# Patient Record
Sex: Female | Born: 1970 | Race: White | Hispanic: No | Marital: Single | Smoking: Current every day smoker
Health system: Southern US, Community
[De-identification: ages and names within clinical notes are randomized; demographics above are authoritative.]

## PROBLEM LIST (undated history)

## (undated) ENCOUNTER — Inpatient Hospital Stay (HOSPITAL_COMMUNITY): Payer: Self-pay

## (undated) DIAGNOSIS — J45909 Unspecified asthma, uncomplicated: Secondary | ICD-10-CM

## (undated) DIAGNOSIS — IMO0002 Reserved for concepts with insufficient information to code with codable children: Secondary | ICD-10-CM

## (undated) DIAGNOSIS — E282 Polycystic ovarian syndrome: Secondary | ICD-10-CM

## (undated) DIAGNOSIS — R87619 Unspecified abnormal cytological findings in specimens from cervix uteri: Secondary | ICD-10-CM

## (undated) DIAGNOSIS — F319 Bipolar disorder, unspecified: Secondary | ICD-10-CM

## (undated) DIAGNOSIS — A6000 Herpesviral infection of urogenital system, unspecified: Secondary | ICD-10-CM

## (undated) DIAGNOSIS — F419 Anxiety disorder, unspecified: Secondary | ICD-10-CM

## (undated) DIAGNOSIS — B999 Unspecified infectious disease: Secondary | ICD-10-CM

## (undated) DIAGNOSIS — F32A Depression, unspecified: Secondary | ICD-10-CM

## (undated) DIAGNOSIS — R590 Localized enlarged lymph nodes: Secondary | ICD-10-CM

## (undated) HISTORY — PX: FRACTURE SURGERY: SHX138

## (undated) HISTORY — PX: RIB FRACTURE SURGERY: SHX2358

## (undated) HISTORY — PX: BARTHOLIN GLAND CYST EXCISION: SHX565

## (undated) HISTORY — DX: Unspecified asthma, uncomplicated: J45.909

## (undated) HISTORY — DX: Reserved for concepts with insufficient information to code with codable children: IMO0002

## (undated) HISTORY — PX: DILATION AND CURETTAGE OF UTERUS: SHX78

## (undated) HISTORY — DX: Unspecified abnormal cytological findings in specimens from cervix uteri: R87.619

## (undated) HISTORY — PX: CYST EXCISION PERINEAL: SHX6278

## (undated) HISTORY — PX: LUNG SURGERY: SHX703

## (undated) HISTORY — DX: Unspecified infectious disease: B99.9

---

## 2008-03-09 ENCOUNTER — Emergency Department: Payer: Self-pay | Admitting: Emergency Medicine

## 2012-09-04 ENCOUNTER — Emergency Department: Payer: Self-pay | Admitting: Internal Medicine

## 2012-09-04 LAB — URINALYSIS, COMPLETE
Blood: NEGATIVE
Leukocyte Esterase: NEGATIVE
Nitrite: NEGATIVE
Ph: 5 (ref 4.5–8.0)
Squamous Epithelial: 2

## 2012-09-04 LAB — CBC
HCT: 37.3 % (ref 35.0–47.0)
HGB: 12.7 g/dL (ref 12.0–16.0)
MCHC: 34 g/dL (ref 32.0–36.0)
RBC: 3.71 10*6/uL — ABNORMAL LOW (ref 3.80–5.20)
WBC: 6.8 10*3/uL (ref 3.6–11.0)

## 2012-09-04 LAB — HCG, QUANTITATIVE, PREGNANCY: Beta Hcg, Quant.: 2749 m[IU]/mL — ABNORMAL HIGH

## 2012-10-19 LAB — OB RESULTS CONSOLE HGB/HCT, BLOOD
HCT: 35 %
Hemoglobin: 12.1 g/dL

## 2012-10-19 LAB — OB RESULTS CONSOLE ABO/RH: RH Type: POSITIVE

## 2012-10-19 LAB — OB RESULTS CONSOLE RPR: RPR: NONREACTIVE

## 2012-10-19 LAB — OB RESULTS CONSOLE ANTIBODY SCREEN: Antibody Screen: NEGATIVE

## 2012-10-21 ENCOUNTER — Ambulatory Visit (HOSPITAL_COMMUNITY): Payer: Medicaid Other

## 2012-10-24 ENCOUNTER — Encounter (HOSPITAL_COMMUNITY): Payer: Self-pay | Admitting: Nurse Practitioner

## 2012-10-28 ENCOUNTER — Encounter (HOSPITAL_COMMUNITY): Payer: Medicaid Other

## 2012-11-01 ENCOUNTER — Ambulatory Visit (HOSPITAL_COMMUNITY): Payer: Medicaid Other | Attending: Nurse Practitioner

## 2012-11-01 ENCOUNTER — Inpatient Hospital Stay (HOSPITAL_COMMUNITY)
Admission: AD | Admit: 2012-11-01 | Discharge: 2012-11-02 | Disposition: A | Discharge status: Home / Self Care | Payer: Medicaid Other | Source: Ambulatory Visit | Attending: Family Medicine | Admitting: Family Medicine

## 2012-11-01 ENCOUNTER — Encounter (HOSPITAL_COMMUNITY): Payer: Self-pay | Admitting: *Deleted

## 2012-11-01 DIAGNOSIS — J329 Chronic sinusitis, unspecified: Secondary | ICD-10-CM

## 2012-11-01 DIAGNOSIS — J069 Acute upper respiratory infection, unspecified: Secondary | ICD-10-CM

## 2012-11-01 DIAGNOSIS — J111 Influenza due to unidentified influenza virus with other respiratory manifestations: Secondary | ICD-10-CM | POA: Insufficient documentation

## 2012-11-01 DIAGNOSIS — J45909 Unspecified asthma, uncomplicated: Secondary | ICD-10-CM

## 2012-11-01 DIAGNOSIS — O99891 Other specified diseases and conditions complicating pregnancy: Secondary | ICD-10-CM | POA: Insufficient documentation

## 2012-11-01 HISTORY — DX: Herpesviral infection of urogenital system, unspecified: A60.00

## 2012-11-01 LAB — CBC WITH DIFFERENTIAL/PLATELET
Basophils Absolute: 0 10*3/uL (ref 0.0–0.1)
Basophils Relative: 0 % (ref 0–1)
Eosinophils Absolute: 0 10*3/uL (ref 0.0–0.7)
Eosinophils Relative: 0 % (ref 0–5)
Hemoglobin: 12.6 g/dL (ref 12.0–15.0)
MCH: 33.5 pg (ref 26.0–34.0)
MCV: 94.1 fL (ref 78.0–100.0)
Metamyelocytes Relative: 0 %
Myelocytes: 0 %
Neutro Abs: 7.1 10*3/uL (ref 1.7–7.7)
Neutrophils Relative %: 72 % (ref 43–77)
RBC: 3.76 MIL/uL — ABNORMAL LOW (ref 3.87–5.11)

## 2012-11-01 LAB — URINALYSIS, ROUTINE W REFLEX MICROSCOPIC
Bilirubin Urine: NEGATIVE
Glucose, UA: NEGATIVE mg/dL
Hgb urine dipstick: NEGATIVE
Specific Gravity, Urine: 1.015 (ref 1.005–1.030)
pH: 6.5 (ref 5.0–8.0)

## 2012-11-01 MED ORDER — DM-GUAIFENESIN ER 30-600 MG PO TB12
1.0000 | ORAL_TABLET | Freq: Two times a day (BID) | ORAL | Status: DC
  Administered 2012-11-01: 1 via ORAL
  Filled 2012-11-01 (×2): qty 1

## 2012-11-01 MED ORDER — DM-GUAIFENESIN ER 30-600 MG PO TB12: 1.0000 | ORAL_TABLET | Freq: Two times a day (BID) | ORAL | Status: DC

## 2012-11-01 MED ORDER — IPRATROPIUM BROMIDE 0.02 % IN SOLN
0.5000 mg | Freq: Once | RESPIRATORY_TRACT | Status: AC
  Administered 2012-11-01: 0.5 mg via RESPIRATORY_TRACT

## 2012-11-01 MED ORDER — IPRATROPIUM BROMIDE 0.02 % IN SOLN
RESPIRATORY_TRACT | Status: AC
  Administered 2012-11-01: 0.5 mg
  Filled 2012-11-01: qty 2.5

## 2012-11-01 MED ORDER — OSELTAMIVIR PHOSPHATE 75 MG PO CAPS: 75.0000 mg | ORAL_CAPSULE | Freq: Two times a day (BID) | ORAL | Status: DC

## 2012-11-01 MED ORDER — ALBUTEROL SULFATE (5 MG/ML) 0.5% IN NEBU
INHALATION_SOLUTION | RESPIRATORY_TRACT | Status: AC
  Filled 2012-11-01: qty 0.5

## 2012-11-01 MED ORDER — OSELTAMIVIR PHOSPHATE 75 MG PO CAPS
75.0000 mg | ORAL_CAPSULE | Freq: Once | ORAL | Status: AC
  Administered 2012-11-01: 75 mg via ORAL
  Filled 2012-11-01: qty 1

## 2012-11-01 MED ORDER — OXYCODONE-ACETAMINOPHEN 5-325 MG PO TABS
2.0000 | ORAL_TABLET | Freq: Once | ORAL | Status: AC
  Administered 2012-11-01: 2 via ORAL
  Filled 2012-11-01: qty 2

## 2012-11-01 MED ORDER — DEXTROSE 5 % IN LACTATED RINGERS IV BOLUS
1000.0000 mL | Freq: Once | INTRAVENOUS | Status: AC
  Administered 2012-11-01: 1000 mL via INTRAVENOUS

## 2012-11-01 MED ORDER — ALBUTEROL SULFATE (5 MG/ML) 0.5% IN NEBU
2.5000 mg | INHALATION_SOLUTION | Freq: Once | RESPIRATORY_TRACT | Status: AC
  Administered 2012-11-01: 2.5 mg via RESPIRATORY_TRACT

## 2012-11-01 MED ORDER — ALBUTEROL SULFATE HFA 108 (90 BASE) MCG/ACT IN AERS: 2.0000 | INHALATION_SPRAY | RESPIRATORY_TRACT | Status: DC | PRN

## 2012-11-01 NOTE — MAU Note (Signed)
Pt reports symptoms of weakness, cough, headache, body aches, pressure in rt side of face and jaw, rt side of face swollen, sinus congestion. States she was seen in the ER the second week of December for abd pain and had a positive preg test and was told she had fibroids. States that since she has been coughing the lower abd pain has been much worse.

## 2012-11-01 NOTE — MAU Provider Note (Signed)
History     CSN: [id number]  Arrival date and time: 11/01/12 1324   First Provider Initiated Contact with Patient 11/01/12 2043      Chief Complaint  Patient presents with  . URI  . Abdominal Pain   HPI  Pt is [redacted]w[redacted]d pregnant [id number]; pt complains of sinus pressure and runny nose Fri morning and neck and ears and jaw hurt with pressure; throat is irritated and feels like glass in her throat. Her sinus drainage is thick bright yellow now mixed with blood.  She is also coughing when awake.   Pt sees [facility] for prenatal care.  Pt denies spotting or bleeding or UTI symptoms.  She has had watery stools with incontinence.  Pt has known fibroids.  She has a headache.  She has been nauseated and vomiting and hasn't been able to eat.  Pt has a hx of asthma but has not used inhaler recently.  Pt took Tylenol once without any relief.    Past Medical History  Diagnosis Date  . Genital HSV     Past Surgical History  Procedure Date  . Cesarean section   . Dilation and curettage of uterus     Family History  Problem Relation Age of Onset  . Hypertension Father   . Hyperlipidemia Father   . Cancer Father     liver, lung, bone  . Asthma Father   . Hypertension Maternal Grandfather   . Hyperlipidemia Maternal Grandfather   . Cancer Maternal Grandfather     lung  . Stroke Maternal Grandfather   . Hypertension Paternal Grandfather   . Hyperlipidemia Paternal Grandfather   . Cancer Paternal Grandfather     lung and liver  . Stroke Paternal Grandfather     History  Substance Use Topics  . Smoking status: Current Every Day Smoker -- 0.2 packs/day    Types: Cigarettes  . Smokeless tobacco: Not on file  . Alcohol Use: No    Allergies:  Allergies  Allergen Reactions  . Codeine Itching and Nausea And Vomiting    Prescriptions prior to admission  Medication Sig Dispense Refill  . acetaminophen (TYLENOL) 325 MG tablet Take 650 mg by mouth every 6 (six) hours as needed. Takes for  pain      . Prenatal Vit-Fe Fumarate-FA (PRENATAL MULTIVITAMIN) TABS Take 1 tablet by mouth every morning.        Review of Systems  Constitutional: Positive for fever and chills.  HENT: Positive for congestion and sore throat.        Pressure over right eye and sinus  Eyes: Negative for blurred vision.  Gastrointestinal: Positive for nausea, vomiting, abdominal pain and diarrhea. Negative for constipation.       Pt has abdominal pain when coughs  Genitourinary: Negative for dysuria, urgency and frequency.  Musculoskeletal: Positive for myalgias.  Neurological: Positive for headaches.   Physical Exam   Blood pressure 132/89, pulse 103, temperature 99.3 F (37.4 C), temperature source Oral, resp. rate 20, height 5\' 5"  (1.651 m), weight 173 lb (78.472 kg), last menstrual period 07/25/2012, SpO2 100.00%.  Physical Exam  Vitals reviewed. Constitutional: She is oriented to person, place, and time. She appears well-developed and well-nourished.       Very uncomfortable  HENT:  Head: Normocephalic.  Mouth/Throat: Oropharynx is clear and moist. No oropharyngeal exudate.  Eyes: Pupils are equal, round, and reactive to light.  Neck: Normal range of motion. Neck supple.  Cardiovascular: Normal rate.   Respiratory: No respiratory distress.  She has wheezes. She has rales.       Wheezes in all 4 lung qaudrants with rales in right lung; anterior rales  GI: Soft.       FHT audible with doppler  Musculoskeletal: Normal range of motion.  Neurological: She is alert and oriented to person, place, and time.  Skin: Skin is warm and dry.  Psychiatric: She has a normal mood and affect.    MAU Course  Procedures Breathing treatment IVF D5LR 1 liter given Mucinex DM and albuterol given- pt felt much better Posterior lungs clear; expiratory wheezes anterior right  Tamiflu given 1st dose Percocet given for headache Assessment and Plan  URI- Mucinex DM; Z-pack, saline sprays Sinusitis Flu-  Tamiflu Bronchitis/asthma- albuterol inhaler prescription F/u with scheduled OB appointment- pt would benefit from going to [facility] due to proximity information given to pt  [facility] 11/01/2012, 8:44 PM

## 2012-11-02 MED ORDER — OXYCODONE-ACETAMINOPHEN 5-325 MG PO TABS: 2.0000 | ORAL_TABLET | ORAL | Status: DC | PRN

## 2012-11-02 NOTE — MAU Provider Note (Signed)
Chart reviewed and agree with management and plan.  

## 2012-11-15 ENCOUNTER — Encounter: Payer: Self-pay | Admitting: Family Medicine

## 2012-11-15 ENCOUNTER — Ambulatory Visit (INDEPENDENT_AMBULATORY_CARE_PROVIDER_SITE_OTHER): Payer: Medicaid Other | Admitting: Family Medicine

## 2012-11-15 ENCOUNTER — Encounter (HOSPITAL_COMMUNITY): Payer: Self-pay | Admitting: Family Medicine

## 2012-11-15 ENCOUNTER — Other Ambulatory Visit: Payer: Self-pay | Admitting: Family Medicine

## 2012-11-15 VITALS — BP 121/75 | Wt 171.0 lb

## 2012-11-15 DIAGNOSIS — Z3689 Encounter for other specified antenatal screening: Secondary | ICD-10-CM

## 2012-11-15 DIAGNOSIS — O09529 Supervision of elderly multigravida, unspecified trimester: Secondary | ICD-10-CM

## 2012-11-15 DIAGNOSIS — O34219 Maternal care for unspecified type scar from previous cesarean delivery: Secondary | ICD-10-CM | POA: Insufficient documentation

## 2012-11-15 DIAGNOSIS — B009 Herpesviral infection, unspecified: Secondary | ICD-10-CM

## 2012-11-15 DIAGNOSIS — O99330 Smoking (tobacco) complicating pregnancy, unspecified trimester: Secondary | ICD-10-CM

## 2012-11-15 DIAGNOSIS — O99312 Alcohol use complicating pregnancy, second trimester: Secondary | ICD-10-CM

## 2012-11-15 DIAGNOSIS — Z7289 Other problems related to lifestyle: Secondary | ICD-10-CM

## 2012-11-15 DIAGNOSIS — O3410 Maternal care for benign tumor of corpus uteri, unspecified trimester: Secondary | ICD-10-CM | POA: Insufficient documentation

## 2012-11-15 DIAGNOSIS — F17200 Nicotine dependence, unspecified, uncomplicated: Secondary | ICD-10-CM

## 2012-11-15 DIAGNOSIS — Z3480 Encounter for supervision of other normal pregnancy, unspecified trimester: Secondary | ICD-10-CM | POA: Insufficient documentation

## 2012-11-15 DIAGNOSIS — D259 Leiomyoma of uterus, unspecified: Secondary | ICD-10-CM | POA: Insufficient documentation

## 2012-11-15 DIAGNOSIS — A609 Anogenital herpesviral infection, unspecified: Secondary | ICD-10-CM | POA: Insufficient documentation

## 2012-11-15 NOTE — Patient Instructions (Addendum)
Pregnancy - Second Trimester The second trimester of pregnancy (3 to 6 months) is a period of rapid growth for you and your baby. At the end of the sixth month, your baby is about 9 inches long and weighs 1 1/2 pounds. You will begin to feel the baby move between 18 and 20 weeks of the pregnancy. This is called quickening. Weight gain is faster. A clear fluid (colostrum) may leak out of your breasts. You may feel small contractions of the womb (uterus). This is known as false labor or Braxton-Hicks contractions. This is like a practice for labor when the baby is ready to be born. Usually, the problems with morning sickness have usually passed by the end of your first trimester. Some women develop small dark blotches (called cholasma, mask of pregnancy) on their face that usually goes away after the baby is born. Exposure to the sun makes the blotches worse. Acne may also develop in some pregnant women and pregnant women who have acne, may find that it goes away. PRENATAL EXAMS  Blood work may continue to be done during prenatal exams. These tests are done to check on your health and the probable health of your baby. Blood work is used to follow your blood levels (hemoglobin). Anemia (low hemoglobin) is common during pregnancy. Iron and vitamins are given to help prevent this. You will also be checked for diabetes between 24 and 28 weeks of the pregnancy. Some of the previous blood tests may be repeated.  The size of the uterus is measured during each visit. This is to make sure that the baby is continuing to grow properly according to the dates of the pregnancy.  Your blood pressure is checked every prenatal visit. This is to make sure you are not getting toxemia.  Your urine is checked to make sure you do not have an infection, diabetes or protein in the urine.  Your weight is checked often to make sure gains are happening at the suggested rate. This is to ensure that both you and your baby are  growing normally.  Sometimes, an ultrasound is performed to confirm the proper growth and development of the baby. This is a test which bounces harmless sound waves off the baby so your caregiver can more accurately determine due dates. Sometimes, a specialized test is done on the amniotic fluid surrounding the baby. This test is called an amniocentesis. The amniotic fluid is obtained by sticking a needle into the belly (abdomen). This is done to check the chromosomes in instances where there is a concern about possible genetic problems with the baby. It is also sometimes done near the end of pregnancy if an early delivery is required. In this case, it is done to help make sure the baby's lungs are mature enough for the baby to live outside of the womb. CHANGES OCCURING IN THE SECOND TRIMESTER OF PREGNANCY Your body goes through many changes during pregnancy. They vary from person to person. Talk to your caregiver about changes you notice that you are concerned about.  During the second trimester, you will likely have an increase in your appetite. It is normal to have cravings for certain foods. This varies from person to person and pregnancy to pregnancy.  Your lower abdomen will begin to bulge.  You may have to urinate more often because the uterus and baby are pressing on your bladder. It is also common to get more bladder infections during pregnancy (pain with urination). You can help this by  drinking lots of fluids and emptying your bladder before and after intercourse.  You may begin to get stretch marks on your hips, abdomen, and breasts. These are normal changes in the body during pregnancy. There are no exercises or medications to take that prevent this change.  You may begin to develop swollen and bulging veins (varicose veins) in your legs. Wearing support hose, elevating your feet for 15 minutes, 3 to 4 times a day and limiting salt in your diet helps lessen the problem.  Heartburn may  develop as the uterus grows and pushes up against the stomach. Antacids recommended by your caregiver helps with this problem. Also, eating smaller meals 4 to 5 times a day helps.  Constipation can be treated with a stool softener or adding bulk to your diet. Drinking lots of fluids, vegetables, fruits, and whole grains are helpful.  Exercising is also helpful. If you have been very active up until your pregnancy, most of these activities can be continued during your pregnancy. If you have been less active, it is helpful to start an exercise program such as walking.  Hemorrhoids (varicose veins in the rectum) may develop at the end of the second trimester. Warm sitz baths and hemorrhoid cream recommended by your caregiver helps hemorrhoid problems.  Backaches may develop during this time of your pregnancy. Avoid heavy lifting, wear low heal shoes and practice good posture to help with backache problems.  Some pregnant women develop tingling and numbness of their hand and fingers because of swelling and tightening of ligaments in the wrist (carpel tunnel syndrome). This goes away after the baby is born.  As your breasts enlarge, you may have to get a bigger bra. Get a comfortable, cotton, support bra. Do not get a nursing bra until the last month of the pregnancy if you will be nursing the baby.  You may get a dark line from your belly button to the pubic area called the linea nigra.  You may develop rosy cheeks because of increase blood flow to the face.  You may develop spider looking lines of the face, neck, arms and chest. These go away after the baby is born. HOME CARE INSTRUCTIONS   It is extremely important to avoid all smoking, herbs, alcohol, and unprescribed drugs during your pregnancy. These chemicals affect the formation and growth of the baby. Avoid these chemicals throughout the pregnancy to ensure the delivery of a healthy infant.  Most of your home care instructions are the same  as suggested for the first trimester of your pregnancy. Keep your caregiver's appointments. Follow your caregiver's instructions regarding medication use, exercise and diet.  During pregnancy, you are providing food for you and your baby. Continue to eat regular, well-balanced meals. Choose foods such as meat, fish, milk and other low fat dairy products, vegetables, fruits, and whole-grain breads and cereals. Your caregiver will tell you of the ideal weight gain.  A physical sexual relationship may be continued up until near the end of pregnancy if there are no other problems. Problems could include early (premature) leaking of amniotic fluid from the membranes, vaginal bleeding, abdominal pain, or other medical or pregnancy problems.  Exercise regularly if there are no restrictions. Check with your caregiver if you are unsure of the safety of some of your exercises. The greatest weight gain will occur in the last 2 trimesters of pregnancy. Exercise will help you:  Control your weight.  Get you in shape for labor and delivery.  Lose weight  after you have the baby.  Wear a good support or jogging bra for breast tenderness during pregnancy. This may help if worn during sleep. Pads or tissues may be used in the bra if you are leaking colostrum.  Do not use hot tubs, steam rooms or saunas throughout the pregnancy.  Wear your seat belt at all times when driving. This protects you and your baby if you are in an accident.  Avoid raw meat, uncooked cheese, cat litter boxes and soil used by cats. These carry germs that can cause birth defects in the baby.  The second trimester is also a good time to visit your dentist for your dental health if this has not been done yet. Getting your teeth cleaned is OK. Use a soft toothbrush. Brush gently during pregnancy.  It is easier to loose urine during pregnancy. Tightening up and strengthening the pelvic muscles will help with this problem. Practice stopping  your urination while you are going to the bathroom. These are the same muscles you need to strengthen. It is also the muscles you would use as if you were trying to stop from passing gas. You can practice tightening these muscles up 10 times a set and repeating this about 3 times per day. Once you know what muscles to tighten up, do not perform these exercises during urination. It is more likely to contribute to an infection by backing up the urine.  Ask for help if you have financial, counseling or nutritional needs during pregnancy. Your caregiver will be able to offer counseling for these needs as well as refer you for other special needs.  Your skin may become oily. If so, wash your face with mild soap, use non-greasy moisturizer and oil or cream based makeup. MEDICATIONS AND DRUG USE IN PREGNANCY  Take prenatal vitamins as directed. The vitamin should contain 1 milligram of folic acid. Keep all vitamins out of reach of children. Only a couple vitamins or tablets containing iron may be fatal to a baby or young child when ingested.  Avoid use of all medications, including herbs, over-the-counter medications, not prescribed or suggested by your caregiver. Only take over-the-counter or prescription medicines for pain, discomfort, or fever as directed by your caregiver. Do not use aspirin.  Let your caregiver also know about herbs you may be using.  Alcohol is related to a number of birth defects. This includes fetal alcohol syndrome. All alcohol, in any form, should be avoided completely. Smoking will cause low birth rate and premature babies.  Street or illegal drugs are very harmful to the baby. They are absolutely forbidden. A baby born to an addicted mother will be addicted at birth. The baby will go through the same withdrawal an adult does. SEEK MEDICAL CARE IF:  You have any concerns or worries during your pregnancy. It is better to call with your questions if you feel they cannot wait,  rather than worry about them. SEEK IMMEDIATE MEDICAL CARE IF:   An unexplained oral temperature above 102 F (38.9 C) develops, or as your caregiver suggests.  You have leaking of fluid from the vagina (birth canal). If leaking membranes are suspected, take your temperature and tell your caregiver of this when you call.  There is vaginal spotting, bleeding, or passing clots. Tell your caregiver of the amount and how many pads are used. Light spotting in pregnancy is common, especially following intercourse.  You develop a bad smelling vaginal discharge with a change in the color from clear  to white.  You continue to feel sick to your stomach (nauseated) and have no relief from remedies suggested. You vomit blood or coffee ground-like materials.  You lose more than 2 pounds of weight or gain more than 2 pounds of weight over 1 week, or as suggested by your caregiver.  You notice swelling of your face, hands, feet, or legs.  You get exposed to [place] measles and have never had them.  You are exposed to fifth disease or chickenpox.  You develop belly (abdominal) pain. Round ligament discomfort is a common non-cancerous (benign) cause of abdominal pain in pregnancy. Your caregiver still must evaluate you.  You develop a bad headache that does not go away.  You develop fever, diarrhea, pain with urination, or shortness of breath.  You develop visual problems, blurry, or double vision.  You fall or are in a car accident or any kind of trauma.  There is mental or physical violence at home. Document Released: 09/08/2001 Document Revised: 12/07/2011 Document Reviewed: 03/13/2009 [facility] Patient Information 2013 [place], [place].  Breastfeeding Deciding to breastfeed is one of the best choices you can make for you and your baby. The information that follows gives a brief overview of the benefits of breastfeeding as well as common topics surrounding breastfeeding. BENEFITS OF  BREASTFEEDING For the baby  The first milk (colostrum) helps the baby's digestive system function better.   There are antibodies in the mother's milk that help the baby fight off infections.   The baby has a lower incidence of asthma, allergies, and sudden infant death syndrome (SIDS).   The nutrients in breast milk are better for the baby than infant formulas, and breast milk helps the baby's brain grow better.   Babies who breastfeed have less gas, colic, and constipation.  For the mother  Breastfeeding helps develop a very special bond between the mother and her baby.   Breastfeeding is convenient, always available at the correct temperature, and costs nothing.   Breastfeeding burns calories in the mother and helps her lose weight that was gained during pregnancy.   Breastfeeding makes the uterus contract back down to normal size faster and slows bleeding following delivery.   Breastfeeding mothers have a lower risk of developing breast cancer.  BREASTFEEDING FREQUENCY  A healthy, full-term baby may breastfeed as often as every hour or space his or her feedings to every 3 hours.   Watch your baby for signs of hunger. Nurse your baby if he or she shows signs of hunger. How often you nurse will vary from baby to baby.   Nurse as often as the baby requests, or when you feel the need to reduce the fullness of your breasts.   Awaken the baby if it has been 3 4 hours since the last feeding.   Frequent feeding will help the mother make more milk and will help prevent problems, such as sore nipples and engorgement of the breasts.  BABY'S POSITION AT THE BREAST  Whether lying down or sitting, be sure that the baby's tummy is facing your tummy.   Support the breast with 4 fingers underneath the breast and the thumb above. Make sure your fingers are well away from the nipple and baby's mouth.   Stroke the baby's lips gently with your finger or nipple.   When the  baby's mouth is open wide enough, place all of your nipple and as much of the areola as possible into your baby's mouth.   Pull the baby in  close so the tip of the nose and the baby's cheeks touch the breast during the feeding.  FEEDINGS AND SUCTION  The length of each feeding varies from baby to baby and from feeding to feeding.   The baby must suck about 2 3 minutes for your milk to get to him or her. This is called a "let down." For this reason, allow the baby to feed on each breast as long as he or she wants. Your baby will end the feeding when he or she has received the right balance of nutrients.   To break the suction, put your finger into the corner of the baby's mouth and slide it between his or her gums before removing your breast from his or her mouth. This will help prevent sore nipples.  HOW TO TELL WHETHER YOUR BABY IS GETTING ENOUGH BREAST MILK. Wondering whether or not your baby is getting enough milk is a common concern among mothers. You can be assured that your baby is getting enough milk if:   Your baby is actively sucking and you hear swallowing.   Your baby seems relaxed and satisfied after a feeding.   Your baby nurses at least 8 12 times in a 24 hour time period. Nurse your baby until he or she unlatches or falls asleep at the first breast (at least 10 20 minutes), then offer the second side.   Your baby is wetting 5 6 disposable diapers (6 8 cloth diapers) in a 24 hour period by 99 76 days of age.   Your baby is having at least 3 4 stools every 24 hours for the first 6 weeks. The stool should be soft and yellow.   Your baby should gain 4 7 ounces per week after he or she is 45 days old.   Your breasts feel softer after nursing.  REDUCING BREAST ENGORGEMENT  In the first week after your baby is born, you may experience signs of breast engorgement. When breasts are engorged, they feel heavy, warm, full, and may be tender to the touch. You can reduce  engorgement if you:   Nurse frequently, every 2 3 hours. Mothers who breastfeed early and often have fewer problems with engorgement.   Place light ice packs on your breasts for 10 20 minutes between feedings. This reduces swelling. Wrap the ice packs in a lightweight towel to protect your skin. Bags of frozen vegetables work well for this purpose.   Take a warm shower or apply warm, moist heat to your breast for 5 10 minutes just before each feeding. This increases circulation and helps the milk flow.   Gently massage your breast before and during the feeding. Using your finger tips, massage from the chest wall towards your nipple in a circular motion.   Make sure that the baby empties at least one breast at every feeding before switching sides.   Use a breast pump to empty the breasts if your baby is sleepy or not nursing well. You may also want to pump if you are returning to work oryou feel you are getting engorged.   Avoid bottle feeds, pacifiers, or supplemental feedings of water or juice in place of breastfeeding. Breast milk is all the food your baby needs. It is not necessary for your baby to have water or formula. In fact, to help your breasts make more milk, it is best not to give your baby supplemental feedings during the early weeks.   Be sure the baby is latched  on and positioned properly while breastfeeding.   Wear a supportive bra, avoiding underwire styles.   Eat a balanced diet with enough fluids.   Rest often, relax, and take your prenatal vitamins to prevent fatigue, stress, and anemia.  If you follow these suggestions, your engorgement should improve in 24 48 hours. If you are still experiencing difficulty, call your lactation consultant or caregiver.  CARING FOR YOURSELF Take care of your breasts  Bathe or shower daily.   Avoid using soap on your nipples.   Start feedings on your left breast at one feeding and on your right breast at the next  feeding.   You will notice an increase in your milk supply 2 5 days after delivery. You may feel some discomfort from engorgement, which makes your breasts very firm and often tender. Engorgement "peaks" out within 24 48 hours. In the meantime, apply warm moist towels to your breasts for 5 10 minutes before feeding. Gentle massage and expression of some milk before feeding will soften your breasts, making it easier for your baby to latch on.   Wear a well-fitting nursing bra, and air dry your nipples for a 3 after each feeding.   Only use cotton bra pads.   Only use pure lanolin on your nipples after nursing. You do not need to wash it off before feeding the baby again. Another option is to express a few drops of breast milk and gently massage it into your nipples.  Take care of yourself  Eat well-balanced meals and nutritious snacks.   Drinking milk, fruit juice, and water to satisfy your thirst (about 8 glasses a day).   Get plenty of rest.  Avoid foods that you notice affect the baby in a bad way.  SEEK MEDICAL CARE IF:   You have difficulty with breastfeeding and need help.   You have a hard, red, sore area on your breast that is accompanied by a fever.   Your baby is too sleepy to eat well or is having trouble sleeping.   Your baby is wetting less than 6 diapers a day, by 64 days of age.   Your baby's skin or white part of his or her eyes is more yellow than it was in the hospital.   You feel depressed.  Document Released: 09/14/2005 Document Revised: 03/15/2012 Document Reviewed: 12/13/2011 [facility] Patient Information 2013 [place], [place]. Alcohol Screening in Pregnancy Drinking at any time during pregnancy can cause problems to the fetus. Alcohol travels from the mother's blood stream to the fetus through the placenta. No one knows the amount of alcohol it takes to cause birth defects. However, the more alcohol the pregnant woman drinks, the more it  harms the fetus. Drinking alcohol when pregnant increases the risk of miscarriage and Fetal Alcohol Syndrome (FAS). It is advised not to drink any kind of alcohol when pregnant. The following are three screening tests that may be used to detect alcohol risk in pregnancy. T-ACE T-ACE is a screening instrument that can identify women's prenatal consumption. T-ACE is proven to be a valuable and efficient tool for identifying a range of alcohol use, including any current prenatal alcohol consumption, prepregnancy risk drinking (defined as more than 2 drinks per drinking day), and lifetime alcohol diagnoses.   T: Tolerance. How many drinks* does it take to make you feel high?  A: Have people Annoyed you by criticizing your drinking?  C: Have you ever felt you should Cut down on your drinking?  E:  Eye opener. Have you ever had a drink first thing in the morning to steady your nerves or get rid of a hangover? *Defined as the consumption of 1 ounce or more of alcohol per day while pregnant.  Scores are calculated as follows:  A reply of more than 2 drinks to question T is considered a positive response and scores 2 points.  A positive answer to question A, C, or E scores 1 point each.  A total score of 2 or more points on the T-ACE indicates a positive outcome for pregnancy risk drinking. If you scored 2 points or higher, your caregiver can guide you to help improve the outcome of your pregnancy. TWEAK TWEAK is used to screen for at-risk drinking defined as the consumption of 1 ounce or more of alcohol per day while pregnant. A total score of 2 or more indicates a positive screen for pregnancy risk drinking.   T: Tolerance. How many drinks can you hold?  If 5 or more = 2 points.  W:  Have people Worried or complained about your drinking in the past year?  Yes = 2 points.  E: Eye opener. Do you sometimes have a drink in the morning when you get up?  Yes = 1 point.  A: Amnesia. Have you  been told about things you said or did while drinking that you could not remember?  Yes = 1 point.  K:  Do you sometimes feel you need to cut down on your drinking?  Yes = 1 point. NIAAA QUESTIONAIRE The [employer] on Alcohol Abuse and Alcoholism (NIAAA) has published The Physician's Guide to Helping Patients with Alcohol Problems. This guide presents a brief model for screening and assessing problems with alcohol. NIAAA recommends screening for alcohol-related problems during routine health exams, before prescribing a medicine that interacts with alcohol, and in response to the discovery of medical problems that may be related to alcohol use.  Do you drink?  Do you use drugs?  On average, how many days per week do you drink alcohol (beer, wine, or liquor)?  On a typical day when you drink, how many drinks do you have?  Positive score: more than 14 drinks for men and more than 7 for women.  What is the maximum number of drinks you had on any given occasion during the past month?  Positive score: more than 4 for men and more than 3 for women. Document Released: 12/03/2004 Document Revised: 12/07/2011 Document Reviewed: 01/20/2010 [facility] Patient Information 2013 [place], [place]. Smoking Cessation Quitting smoking is important to your health and has many advantages. However, it is not always easy to quit since nicotine is a very addictive drug. Often times, people try 3 times or more before being able to quit. This document explains the best ways for you to prepare to quit smoking. Quitting takes hard work and a lot of effort, but you can do it. ADVANTAGES OF QUITTING SMOKING  You will live longer, feel better, and live better.  Your body will feel the impact of quitting smoking almost immediately.  Within 20 minutes, blood pressure decreases. Your pulse returns to its normal level.  After 8 hours, carbon monoxide levels in the blood return to normal. Your oxygen level  increases.  After 24 hours, the chance of having a heart attack starts to decrease. Your breath, hair, and body stop smelling like smoke.  After 48 hours, damaged nerve endings begin to recover. Your sense of taste and smell improve.  After 72 hours, the body is virtually free of nicotine. Your bronchial tubes relax and breathing becomes easier.  After 2 to 12 weeks, lungs can hold more air. Exercise becomes easier and circulation improves.  The risk of having a heart attack, stroke, cancer, or lung disease is greatly reduced.  After 1 year, the risk of coronary heart disease is cut in half.  After 5 years, the risk of stroke falls to the same as a nonsmoker.  After 10 years, the risk of lung cancer is cut in half and the risk of other cancers decreases significantly.  After 15 years, the risk of coronary heart disease drops, usually to the level of a nonsmoker.  If you are pregnant, quitting smoking will improve your chances of having a healthy baby.  The people you live with, especially any children, will be healthier.  You will have extra money to spend on things other than cigarettes. QUESTIONS TO THINK ABOUT BEFORE ATTEMPTING TO QUIT You may want to talk about your answers with your caregiver.  Why do you want to quit?  If you tried to quit in the past, what helped and what did not?  What will be the most difficult situations for you after you quit? How will you plan to handle them?  Who can help you through the tough times? Your family? Friends? A caregiver?  What pleasures do you get from smoking? What ways can you still get pleasure if you quit? Here are some questions to ask your caregiver:  How can you help me to be successful at quitting?  What medicine do you think would be best for me and how should I take it?  What should I do if I need more help?  What is smoking withdrawal like? How can I get information on withdrawal? GET READY  Set a quit  date.  Change your environment by getting rid of all cigarettes, ashtrays, matches, and lighters in your home, car, or work. Do not let people smoke in your home.  Review your past attempts to quit. Think about what worked and what did not. GET SUPPORT AND ENCOURAGEMENT You have a better chance of being successful if you have help. You can get support in many ways.  Tell your family, friends, and co-workers that you are going to quit and need their support. Ask them not to smoke around you.  Get individual, group, or telephone counseling and support. Programs are available at [employer] and health centers. Call your local health department for information about programs in your area.  Spiritual beliefs and practices may help some smokers quit.  Download a "quit meter" on your computer to keep track of quit statistics, such as how long you have gone without smoking, cigarettes not smoked, and money saved.  Get a self-help book about quitting smoking and staying off of tobacco. LEARN NEW SKILLS AND BEHAVIORS  Distract yourself from urges to smoke. Talk to someone, go for a walk, or occupy your time with a task.  Change your normal routine. Take a different route to work. Drink tea instead of coffee. Eat breakfast in a different place.  Reduce your stress. Take a hot bath, exercise, or read a book.  Plan something enjoyable to do every day. Reward yourself for not smoking.  Explore interactive web-based programs that specialize in helping you quit. GET MEDICINE AND USE IT CORRECTLY Medicines can help you stop smoking and decrease the urge to smoke. Combining medicine with the above  behavioral methods and support can greatly increase your chances of successfully quitting smoking.  Nicotine replacement therapy helps deliver nicotine to your body without the negative effects and risks of smoking. Nicotine replacement therapy includes nicotine gum, lozenges, inhalers, nasal sprays, and  skin patches. Some may be available over-the-counter and others require a prescription.  Antidepressant medicine helps people abstain from smoking, but how this works is unknown. This medicine is available by prescription.  Nicotinic receptor partial agonist medicine simulates the effect of nicotine in your brain. This medicine is available by prescription. Ask your caregiver for advice about which medicines to use and how to use them based on your health history. Your caregiver will tell you what side effects to look out for if you choose to be on a medicine or therapy. Carefully read the information on the package. Do not use any other product containing nicotine while using a nicotine replacement product.  RELAPSE OR DIFFICULT SITUATIONS Most relapses occur within the first 3 months after quitting. Do not be discouraged if you start smoking again. Remember, most people try several times before finally quitting. You may have symptoms of withdrawal because your body is used to nicotine. You may crave cigarettes, be irritable, feel very hungry, cough often, get headaches, or have difficulty concentrating. The withdrawal symptoms are only temporary. They are strongest when you first quit, but they will go away within 10 14 days. To reduce the chances of relapse, try to:  Avoid drinking alcohol. Drinking lowers your chances of successfully quitting.  Reduce the amount of caffeine you consume. Once you quit smoking, the amount of caffeine in your body increases and can give you symptoms, such as a rapid heartbeat, sweating, and anxiety.  Avoid smokers because they can make you want to smoke.  Do not let weight gain distract you. Many smokers will gain weight when they quit, usually less than 10 pounds. Eat a healthy diet and stay active. You can always lose the weight gained after you quit.  Find ways to improve your mood other than smoking. FOR MORE INFORMATION  www.smokefree.gov  Document  Released: 09/08/2001 Document Revised: 03/15/2012 Document Reviewed: 12/24/2011 [facility] Patient Information 2013 [place], [place].

## 2012-11-15 NOTE — Progress Notes (Signed)
Transfer from [facility]--doing well--needs genetics-missed appt.  Anatomy scheduled. Trying to get into Room at the [facility] for alcohol use Records reviewed.

## 2012-11-16 ENCOUNTER — Encounter: Payer: Self-pay | Admitting: *Deleted

## 2012-11-25 ENCOUNTER — Inpatient Hospital Stay (HOSPITAL_COMMUNITY)
Admission: AD | Admit: 2012-11-25 | Discharge: 2012-11-25 | Disposition: A | Discharge status: Home / Self Care | Payer: Medicaid Other | Source: Ambulatory Visit | Attending: Obstetrics and Gynecology | Admitting: Obstetrics and Gynecology

## 2012-11-25 ENCOUNTER — Encounter (HOSPITAL_COMMUNITY): Payer: Self-pay | Admitting: Family

## 2012-11-25 ENCOUNTER — Inpatient Hospital Stay (HOSPITAL_COMMUNITY): Payer: Medicaid Other

## 2012-11-25 DIAGNOSIS — O4690 Antepartum hemorrhage, unspecified, unspecified trimester: Secondary | ICD-10-CM

## 2012-11-25 DIAGNOSIS — O4692 Antepartum hemorrhage, unspecified, second trimester: Secondary | ICD-10-CM

## 2012-11-25 DIAGNOSIS — O209 Hemorrhage in early pregnancy, unspecified: Secondary | ICD-10-CM | POA: Insufficient documentation

## 2012-11-25 MED ORDER — ACETAMINOPHEN 325 MG PO TABS
650.0000 mg | ORAL_TABLET | Freq: Once | ORAL | Status: AC
  Administered 2012-11-25: 650 mg via ORAL
  Filled 2012-11-25: qty 2

## 2012-11-25 NOTE — MAU Provider Note (Signed)
Chief Complaint: No chief complaint on file.   First Provider Initiated Contact with Patient 11/25/12 1843     SUBJECTIVE HPI: Stephanie Warren is a 42 y.o. G5P1031 at [redacted]w[redacted]d by LMP (no [place]) who presents with onset today of first epidsode of small-moderate amount BRB. Known fibroids. Experiencing bilateral groin pain for weeks , worse with coughing or moving. Denies current substance abuse and states she has quit smoking and alcohol. Denies abdominal trauma or antecedent intercourse. Initial prenatal visit at health department with cultures but did not keep ultrasound appointment. Moved to [place] and had one visit at [facility]. Has moved back to [place] to live at [facility] and now at [facility]   Past Medical History  Diagnosis Date  . Genital HSV   . Abnormal Pap smear   . Infection     yeast  . Asthma    OB History   Grav Para Term Preterm Abortions TAB SAB Ect Mult Living   5 1 1  3 2 1   1      # Outc Date GA Lbr Len/2nd Wgt Sex Del Anes PTL Lv   1 TAB 1991           2 TRM 1997 [redacted]w[redacted]d  3.175kg(7lb) M LTCS   Yes   Comments: Arrest of descent   3 SAB 2003 [redacted]w[redacted]d          4 TAB 2005           5 CUR              Past Surgical History  Procedure Laterality Date  . Cesarean section    . Dilation and curettage of uterus      x3  . Rib fracture surgery      left side / 10 ribs broken. Horse back riding accident.  . Cyst excision perineal    . Bartholin gland cyst excision    . Fracture surgery    . Lung surgery      left lung puncture   History   Social History  . Marital Status: Single    Spouse Name: N/A    Number of Children: N/A  . Years of Education: N/A   Occupational History  . Not on file.   Social History Main Topics  . Smoking status: Current Every Day Smoker -- 0.25 packs/day    Types: Cigarettes  . Smokeless tobacco: Not on file  . Alcohol Use: Yes     Comment: Beer . Maybe 4 amonth  . Drug Use: No  . Sexually Active: Yes   Other Topics  Concern  . Not on file   Social History Narrative  . No narrative on file   No current facility-administered medications on file prior to encounter.   Current Outpatient Prescriptions on File Prior to Encounter  Medication Sig Dispense Refill  . albuterol (PROVENTIL HFA;VENTOLIN HFA) 108 (90 BASE) MCG/ACT inhaler Inhale 2 puffs into the lungs every 4 (four) hours as needed for wheezing.  1 Inhaler  0  . dextromethorphan-guaiFENesin (MUCINEX DM) 30-600 MG per 12 hr tablet Take 1 tablet by mouth 2 (two) times daily.  30 tablet  0  . Prenatal Vit-Fe Fumarate-FA (PRENATAL MULTIVITAMIN) TABS Take 1 tablet by mouth every morning.       Allergies  Allergen Reactions  . Codeine Itching and Nausea And Vomiting    ROS: Pertinent items in HPI  OBJECTIVE Blood pressure 115/79, pulse 92, temperature 97.9 F (36.6 C),  temperature source Oral, resp. rate 18, height 5\' 5"  (1.651 m), weight 77.565 kg (171 lb), last menstrual period 07/25/2012. GENERAL: Well-developed, well-nourished female in no acute distress.  HEENT: Normocephalic HEART: normal rate RESP: normal effort ABDOMEN: Soft, minimally tender at fundus and bilaterally in groin EXTREMITIES: Nontender, no edema NEURO: Alert and oriented SPECULUM EXAM: Small amount of dark blood noted.  No active bleeding from cervix at time of exam.  Bimanual deferred due to ultrasound results.  LAB RESULTS No results found for this or any previous visit (from the past 24 hour(s)).  IMAGING Limited US: Clinical Data: Bleeding.  LIMITED OBSTETRIC ULTRASOUND  Number of Fetuses: 1  Heart Rate: 163 bpm  Movement: present  Breathing: Not assessed.  Presentation: Cephalic  Placental Location: Anterior.  Previa: Absent. The placenta appears to be attached to synechiae  near the internal os.  Amniotic Fluid (Subjective): Normal  Vertical Pocket 5.0 cm  BPD: 4.1 cm 18 w 4 d  MATERNAL FINDINGS:  Cervix: 2.9 cm.  Uterus/Adnexae: Synechiae cross the  internal os. Ovaries not  visualized.  IMPRESSION:  Single intrauterine pregnancy as above.  Recommend followup with non-emergent complete OB 14+ wk US  examination for fetal biometric evaluation and anatomic survey if  not already performed.   MAU COURSE  ASSESSMENT No diagnosis found.  PLAN Discharge home Pelvic Rest Take Tylenol 325 mg 2 tablets by mouth every 4 hours if needed for pain. Drink at least 8 8-oz glasses of water every day. MFM appointment this week.   Care from [facility] being transferred to [facility] according to client but does not have an appointment time.  Message sent to [facility] to contact client re: appointment.    Medication List    ASK your doctor about these medications       albuterol 108 (90 BASE) MCG/ACT inhaler  Commonly known as:  PROVENTIL HFA;VENTOLIN HFA  Inhale 2 puffs into the lungs every 4 (four) hours as needed for wheezing.     dextromethorphan-guaiFENesin 30-600 MG per 12 hr tablet  Commonly known as:  MUCINEX DM  Take 1 tablet by mouth 2 (two) times daily.     prenatal multivitamin Tabs  Take 1 tablet by mouth every morning.         Danae Orleans, CNM 11/25/2012  6:53 PM  Care assumed by Nolene Bernheim, NP at [address], CNM 11/25/2012 7:48 PM

## 2012-11-25 NOTE — MAU Note (Signed)
Patient presents by EMS to MAU with c/o vaginal bleeding; patient states she noticed pink tinged blood this morning; has walked about 4 miles today. Reports she had red blood and 3-4 "stringy" clots at 1700 today.  Reports intermittent lower abdominal cramping throughout the day.

## 2012-11-29 ENCOUNTER — Ambulatory Visit (HOSPITAL_COMMUNITY)
Admission: RE | Admit: 2012-11-29 | Discharge: 2012-11-29 | Disposition: A | Discharge status: Home / Self Care | Payer: Medicaid Other | Source: Ambulatory Visit | Attending: Family Medicine | Admitting: Family Medicine

## 2012-11-29 ENCOUNTER — Other Ambulatory Visit: Payer: Self-pay

## 2012-11-29 DIAGNOSIS — Z3689 Encounter for other specified antenatal screening: Secondary | ICD-10-CM

## 2012-11-29 DIAGNOSIS — O34219 Maternal care for unspecified type scar from previous cesarean delivery: Secondary | ICD-10-CM | POA: Insufficient documentation

## 2012-11-29 DIAGNOSIS — O358XX0 Maternal care for other (suspected) fetal abnormality and damage, not applicable or unspecified: Secondary | ICD-10-CM | POA: Insufficient documentation

## 2012-11-29 DIAGNOSIS — O09529 Supervision of elderly multigravida, unspecified trimester: Secondary | ICD-10-CM

## 2012-11-29 DIAGNOSIS — O99330 Smoking (tobacco) complicating pregnancy, unspecified trimester: Secondary | ICD-10-CM | POA: Insufficient documentation

## 2012-11-29 DIAGNOSIS — O209 Hemorrhage in early pregnancy, unspecified: Secondary | ICD-10-CM | POA: Insufficient documentation

## 2012-11-29 DIAGNOSIS — Z1389 Encounter for screening for other disorder: Secondary | ICD-10-CM | POA: Insufficient documentation

## 2012-11-29 DIAGNOSIS — Z363 Encounter for antenatal screening for malformations: Secondary | ICD-10-CM | POA: Insufficient documentation

## 2012-11-29 LAB — AP-AFP (ALPHA FETOPROTEIN)

## 2012-11-30 ENCOUNTER — Encounter: Payer: Self-pay | Admitting: Family Medicine

## 2012-11-30 NOTE — MAU Provider Note (Signed)
Attestation of Attending Supervision of Advanced Practitioner (CNM/NP): Evaluation and management procedures were performed by the Advanced Practitioner under my supervision and collaboration.  I have reviewed the Advanced Practitioner's note and chart, and I agree with the management and plan.  Karmela Bram 11/30/2012 9:09 AM   

## 2012-12-01 NOTE — Progress Notes (Signed)
Genetic Counseling  High-Risk Gestation Note  Appointment Date:  12/01/2012 Referred By: Reva Bores, MD Date of Birth:  [birth year]    Pregnancy History: [id number] Estimated Date of Delivery: 05/01/13 Estimated Gestational Age: [redacted]w[redacted]d Attending: Particia Nearing, MD   Ms Myiah Petkus was seen for genetic counseling because of a maternal age of 42 y.o..     She was counseled regarding maternal age and the association with risk for chromosome conditions due to nondisjunction with aging of the ova.   We reviewed chromosomes, nondisjunction, and the associated 1 in 35 risk for fetal aneuploidy at [redacted]w[redacted]d gestation related to a maternal age of 42 y.o. at delivery.  She was counseled that the risk for aneuploidy decreases as gestational age increases, accounting for those pregnancies which spontaneously abort.  We specifically discussed Down syndrome (trisomy 50), trisomies 90 and 21, and sex chromosome aneuploidies (47,XXX and 47,XXY) including the common features and prognoses of each.   We reviewed other available screening options including Quad screen, noninvasive prenatal testing (NIPT), and detailed ultrasound. She understands that screening tests are used to modify a patient's a priori risk for aneuploidy, typically based on age.  This estimate provides a pregnancy specific risk assessment.  Specifically, we discussed that NIPT analyzes cell free fetal DNA found in the maternal circulation. This test is not diagnostic for chromosome conditions, but can provide information regarding the presence or absence of extra fetal DNA for chromosomes 13, 18, 21, X, and Y, and missing fetal DNA for chromosome X and Y (Turner syndrome). Thus, it would not identify or rule out all genetic conditions. The reported detection rate is greater than 99% for Trisomy 21, greater than 98% for Trisomy 18, and is approximately 80% (8 out of 10) for Trisomy 13. The false positive rate is reported to be less than 0.1% for any of  these conditions.  In addition, we discussed that ~50-80% of fetuses with Down syndrome and up to 90-95% of fetuses with trisomy 18/13, when well visualized, have detectable anomalies or soft markers by detailed ultrasound (~18+ weeks gestation). She was also counseled regarding diagnostic testing via amniocentesis.  We reviewed the approximate 1 in 300-500 risk for complications for amniocentesis, including spontaneous pregnancy loss. We discussed the risks, limitations, and benefits of each screening and testing option.   After consideration of all the options,Ms. Noralee Stain elected to proceed with cell free fetal DNA testing ([facility]) as well as AFP only screening for open neural tube defects. She declined amniocentesis given the associated risk of complications and given that she is not interested in invasive testing in the pregnancy. NIPT results will be available in 8-10 days.  A detailed ultrasound was performed today.  The ultrasound report will be sent under separate cover. She understands that ultrasound cannot rule out all birth defects or genetic syndromes. A follow-up ultrasound was planned in 4 weeks to complete fetal anatomic survey.   Ms. Makalynn Berwanger was provided with written information regarding cystic fibrosis (CF) including the carrier frequency and incidence in the Caucasian population, the availability of carrier testing and prenatal diagnosis if indicated.  In addition, we discussed that CF is routinely screened for as part of the [place] newborn screening panel.  After careful consideration, she elected to proceed with CF testing today.   Both family histories were reviewed and found to be contributory for a female paternal first cousin to the patient with spina bifida. She reportedly has multiple surgeries including shunt placement. She is reportedly  otherwise healthy and has one daughter who is reportedly healthy. We discussed that spina bifida is a type of open neural tube defect  and affects approximately 1 in 1000 live births. Ms. Juliann Pares was counseled that NTDs occurs as an isolated finding, in the majority of cases.  Isolated NTDs are usually inherited in a multifactorial manner in which there is no prior family history.  Multifactorial conditions have both environmental and genetic factors that contribute to their development.  Both the genetic and environmental factors that contribute to the development of spina bifida are largely unknown; however, some medications and health conditions, such as uncontrolled diabetes and obesity, may increase the chance of spina bifida. We also discussed that NTDs may occur as a feature of an underlying genetic syndrome or condition.  If this relative had a nonsyndromic, multifactorial NTD, the risk of recurrence for the current pregnancy, a fourth degree relative, would not be expected to be increased above the general population risk.  If however, the relative has an underlying genetic syndrome, the risk of recurrence depends upon the inheritance of the condition. We discussed prenatal screening and diagnostic options for detection of NTDs.  Specifically, we reviewed MSAFP and ultrasound. Ms. Daisa Stennis understands that MSAFP does not diagnose or rule out NTDs but rather provides a pregnancy specific risk assessment. We reviewed that detailed ultrasound detects between 80 and 90% of NTDs, when the fetus is well visualized.  Further genetic counseling is warranted if more information is obtained.  Ms. Fahey reported two potential fathers for the pregnancy. She reported no known family history of birth defects, mental retardation, recurrent pregnancy loss, or known genetic conditions for either. However, she had limited information regarding the second potential father of the pregnancy aside from 4 previous healthy children for him. We, thus, cannot comment on how his family history may impact the chance for a birth defect or genetic condition in the  pregnancy. Without further information regarding the provided family history, an accurate genetic risk cannot be calculated. Further genetic counseling is warranted if more information is obtained.  Ms. Schoppe denied exposure to environmental toxins or chemical agents. She denied the use of tobacco or street drugs. She reported drinking alcohol during the first trimester of pregnancy. Ms. Mackintosh stated that she was not aware of the pregnancy until approximately [redacted] weeks gestation. She reported that prior to that time she drank approximately 1-2 fifths of alcohol per week. She stated that she last drank alcohol on 09/27/12. We discussed that prenatal alcohol exposure can increase the risk for growth delays, small head size, heart defects, eye and facial differences, as well as behavior problems and learning disabilities. The risk of these to occur tends to increase with the amount of alcohol consumed. However, because there is no identified safe amount of alcohol in pregnancy, it is recommended to completely avoid alcohol in pregnancy. Given the reported amount of exposure in the first trimester of pregnancy, the risk for associated effects would be increased for the current pregnancy. The patient understands that we are not able to fully assess for fetal alcohol effects during a pregnancy. However, targeted ultrasound is available to assess fetal growth and anatomy in detail. We also discussed the option of fetal echocardiogram to assess the fetal heart in detail given the early alcohol exposure. Ms. Racine Erby declined fetal echocardiogram as this time.    Ms. Dynesha Woolen denied significant viral illnesses during the course of her pregnancy. Her medical and surgical histories were contributory  for asthma, for which she has albuterol inhaler. She also reported taking zyrtec, oxycodone, mucinex, and nasonex during the pregnancy. Prenatal use of oxycodone has not been confirmed to have an increased risk for  birth defects, though its use was associated with an increase in pulmonary valve stenosis in one retrospective study. Use of opioid medication in the third trimester has been associated with neonatal withdrawal. She should not alter her medication use without consulting with her physician first.  I counseled Ms. Noralee Stain regarding the above risks and available options.  The approximate face-to-face time with the genetic counselor was 45 minutes.  Quinn Plowman, MS,  Certified Genetic Counselor 12/01/2012

## 2012-12-09 ENCOUNTER — Telehealth (HOSPITAL_COMMUNITY): Payer: Self-pay | Admitting: MS"

## 2012-12-09 NOTE — Telephone Encounter (Signed)
Called Stephanie Warren to discuss her [facility], cell free fetal DNA testing. Patient was identified by name and DOB. Testing was offered because of advanced maternal age. We reviewed that these are within normal limits, showing a less than 1 in 10,000 risk for trisomies 21, 18 and 13. We reviewed that this testing identifies > 99% of pregnancies with trisomy 21, >98% of pregnancies with trisomy 46, and >80% with trisomy 34; the false positive rate is <0.1% for all conditions. Testing was also performed for X and Y chromosome analysis. This did not show evidence of aneuploidy for X or Y. It was also consistent with female gender. X and Y analysis has a detection rate of approximately 99%. She understands that this testing does not identify all genetic conditions.  Additionally, we discussed results of her cystic fibrosis carrier screening. Carrier screening for cystic fibrosis yielded a normal/negative result for the 97 most common disease-causing mutations, meaning that the risk to be a CF carrier can be reduced from 1 in 25 to approximately 1 in 343.  Therefore, the risk for cystic fibrosis in her current pregnancy is significantly reduced. The patient understands that CF carrier screening can reduce but not eliminate the chance to be a CF carrier.   All questions were answered to her satisfaction, she was encouraged to call with additional questions or concerns.   Quinn Plowman, MS Certified Genetic Counselor 12/09/2012 3:43 PM

## 2012-12-09 NOTE — Telephone Encounter (Signed)
Left message for patient to return call.   Stephanie Warren 12/09/2012 2:12 PM

## 2012-12-12 ENCOUNTER — Ambulatory Visit (INDEPENDENT_AMBULATORY_CARE_PROVIDER_SITE_OTHER): Payer: Medicaid Other | Admitting: Obstetrics and Gynecology

## 2012-12-12 ENCOUNTER — Encounter: Payer: Self-pay | Admitting: Obstetrics and Gynecology

## 2012-12-12 ENCOUNTER — Other Ambulatory Visit: Payer: Self-pay | Admitting: Obstetrics and Gynecology

## 2012-12-12 VITALS — BP 106/74 | Temp 98.7°F | Wt 178.1 lb

## 2012-12-12 DIAGNOSIS — O09529 Supervision of elderly multigravida, unspecified trimester: Secondary | ICD-10-CM

## 2012-12-12 DIAGNOSIS — Z3482 Encounter for supervision of other normal pregnancy, second trimester: Secondary | ICD-10-CM

## 2012-12-12 LAB — POCT URINALYSIS DIP (DEVICE)
Bilirubin Urine: NEGATIVE
Glucose, UA: NEGATIVE mg/dL
Hgb urine dipstick: NEGATIVE
Ketones, ur: NEGATIVE mg/dL
Specific Gravity, Urine: 1.02 (ref 1.005–1.030)

## 2012-12-12 MED ORDER — INFLUENZA VIRUS VACC SPLIT PF IM SUSP: 0.5000 mL | Freq: Once | INTRAMUSCULAR | Status: DC

## 2012-12-12 MED ORDER — TETANUS-DIPHTH-ACELL PERTUSSIS 5-2.5-18.5 LF-MCG/0.5 IM SUSP: 0.5000 mL | Freq: Once | INTRAMUSCULAR | Status: DC

## 2012-12-12 NOTE — Progress Notes (Signed)
PN records from [facility] reviewed. NIPT nl and 18 wk MFM scan normal with need for F/U anatomy at 22 wks. (12/27/12). Seen 2/28 for red vag bleeding and continues to have slight brown-yellow discharge that is very irritative and pruitic. Used vag suppositories for presumptive yeast from MFM but sx not improved. SSE: no blood, greyish discharge. WP sent. SVE: L/C/H. RLP discussed.

## 2012-12-12 NOTE — Patient Instructions (Signed)
Pregnancy - Second Trimester The second trimester of pregnancy (3 to 6 months) is a period of rapid growth for you and your baby. At the end of the sixth month, your baby is about 9 inches long and weighs 1 1/2 pounds. You will begin to feel the baby move between 18 and 20 weeks of the pregnancy. This is called quickening. Weight gain is faster. A clear fluid (colostrum) may leak out of your breasts. You may feel small contractions of the womb (uterus). This is known as false labor or Braxton-Hicks contractions. This is like a practice for labor when the baby is ready to be born. Usually, the problems with morning sickness have usually passed by the end of your first trimester. Some women develop small dark blotches (called cholasma, mask of pregnancy) on their face that usually goes away after the baby is born. Exposure to the sun makes the blotches worse. Acne may also develop in some pregnant women and pregnant women who have acne, may find that it goes away. PRENATAL EXAMS  Blood work may continue to be done during prenatal exams. These tests are done to check on your health and the probable health of your baby. Blood work is used to follow your blood levels (hemoglobin). Anemia (low hemoglobin) is common during pregnancy. Iron and vitamins are given to help prevent this. You will also be checked for diabetes between 24 and 28 weeks of the pregnancy. Some of the previous blood tests may be repeated.  The size of the uterus is measured during each visit. This is to make sure that the baby is continuing to grow properly according to the dates of the pregnancy.  Your blood pressure is checked every prenatal visit. This is to make sure you are not getting toxemia.  Your urine is checked to make sure you do not have an infection, diabetes or protein in the urine.  Your weight is checked often to make sure gains are happening at the suggested rate. This is to ensure that both you and your baby are growing  normally.  Sometimes, an ultrasound is performed to confirm the proper growth and development of the baby. This is a test which bounces harmless sound waves off the baby so your caregiver can more accurately determine due dates. Sometimes, a specialized test is done on the amniotic fluid surrounding the baby. This test is called an amniocentesis. The amniotic fluid is obtained by sticking a needle into the belly (abdomen). This is done to check the chromosomes in instances where there is a concern about possible genetic problems with the baby. It is also sometimes done near the end of pregnancy if an early delivery is required. In this case, it is done to help make sure the baby's lungs are mature enough for the baby to live outside of the womb. CHANGES OCCURING IN THE SECOND TRIMESTER OF PREGNANCY Your body goes through many changes during pregnancy. They vary from person to person. Talk to your caregiver about changes you notice that you are concerned about.  During the second trimester, you will likely have an increase in your appetite. It is normal to have cravings for certain foods. This varies from person to person and pregnancy to pregnancy.  Your lower abdomen will begin to bulge.  You may have to urinate more often because the uterus and baby are pressing on your bladder. It is also common to get more bladder infections during pregnancy (pain with urination). You can help this by   drinking lots of fluids and emptying your bladder before and after intercourse.  You may begin to get stretch marks on your hips, abdomen, and breasts. These are normal changes in the body during pregnancy. There are no exercises or medications to take that prevent this change.  You may begin to develop swollen and bulging veins (varicose veins) in your legs. Wearing support hose, elevating your feet for 15 minutes, 3 to 4 times a day and limiting salt in your diet helps lessen the problem.  Heartburn may develop  as the uterus grows and pushes up against the stomach. Antacids recommended by your caregiver helps with this problem. Also, eating smaller meals 4 to 5 times a day helps.  Constipation can be treated with a stool softener or adding bulk to your diet. Drinking lots of fluids, vegetables, fruits, and whole grains are helpful.  Exercising is also helpful. If you have been very active up until your pregnancy, most of these activities can be continued during your pregnancy. If you have been less active, it is helpful to start an exercise program such as walking.  Hemorrhoids (varicose veins in the rectum) may develop at the end of the second trimester. Warm sitz baths and hemorrhoid cream recommended by your caregiver helps hemorrhoid problems.  Backaches may develop during this time of your pregnancy. Avoid heavy lifting, wear low heal shoes and practice good posture to help with backache problems.  Some pregnant women develop tingling and numbness of their hand and fingers because of swelling and tightening of ligaments in the wrist (carpel tunnel syndrome). This goes away after the baby is born.  As your breasts enlarge, you may have to get a bigger bra. Get a comfortable, cotton, support bra. Do not get a nursing bra until the last month of the pregnancy if you will be nursing the baby.  You may get a dark line from your belly button to the pubic area called the linea nigra.  You may develop rosy cheeks because of increase blood flow to the face.  You may develop spider looking lines of the face, neck, arms and chest. These go away after the baby is born. HOME CARE INSTRUCTIONS   It is extremely important to avoid all smoking, herbs, alcohol, and unprescribed drugs during your pregnancy. These chemicals affect the formation and growth of the baby. Avoid these chemicals throughout the pregnancy to ensure the delivery of a healthy infant.  Most of your home care instructions are the same as  suggested for the first trimester of your pregnancy. Keep your caregiver's appointments. Follow your caregiver's instructions regarding medication use, exercise and diet.  During pregnancy, you are providing food for you and your baby. Continue to eat regular, well-balanced meals. Choose foods such as meat, fish, milk and other low fat dairy products, vegetables, fruits, and whole-grain breads and cereals. Your caregiver will tell you of the ideal weight gain.  A physical sexual relationship may be continued up until near the end of pregnancy if there are no other problems. Problems could include early (premature) leaking of amniotic fluid from the membranes, vaginal bleeding, abdominal pain, or other medical or pregnancy problems.  Exercise regularly if there are no restrictions. Check with your caregiver if you are unsure of the safety of some of your exercises. The greatest weight gain will occur in the last 2 trimesters of pregnancy. Exercise will help you:  Control your weight.  Get you in shape for labor and delivery.  Lose weight   after you have the baby.  Wear a good support or jogging bra for breast tenderness during pregnancy. This may help if worn during sleep. Pads or tissues may be used in the bra if you are leaking colostrum.  Do not use hot tubs, steam rooms or saunas throughout the pregnancy.  Wear your seat belt at all times when driving. This protects you and your baby if you are in an accident.  Avoid raw meat, uncooked cheese, cat litter boxes and soil used by cats. These carry germs that can cause birth defects in the baby.  The second trimester is also a good time to visit your dentist for your dental health if this has not been done yet. Getting your teeth cleaned is OK. Use a soft toothbrush. Brush gently during pregnancy.  It is easier to loose urine during pregnancy. Tightening up and strengthening the pelvic muscles will help with this problem. Practice stopping your  urination while you are going to the bathroom. These are the same muscles you need to strengthen. It is also the muscles you would use as if you were trying to stop from passing gas. You can practice tightening these muscles up 10 times a set and repeating this about 3 times per day. Once you know what muscles to tighten up, do not perform these exercises during urination. It is more likely to contribute to an infection by backing up the urine.  Ask for help if you have financial, counseling or nutritional needs during pregnancy. Your caregiver will be able to offer counseling for these needs as well as refer you for other special needs.  Your skin may become oily. If so, wash your face with mild soap, use non-greasy moisturizer and oil or cream based makeup. MEDICATIONS AND DRUG USE IN PREGNANCY  Take prenatal vitamins as directed. The vitamin should contain 1 milligram of folic acid. Keep all vitamins out of reach of children. Only a couple vitamins or tablets containing iron may be fatal to a baby or young child when ingested.  Avoid use of all medications, including herbs, over-the-counter medications, not prescribed or suggested by your caregiver. Only take over-the-counter or prescription medicines for pain, discomfort, or fever as directed by your caregiver. Do not use aspirin.  Let your caregiver also know about herbs you may be using.  Alcohol is related to a number of birth defects. This includes fetal alcohol syndrome. All alcohol, in any form, should be avoided completely. Smoking will cause low birth rate and premature babies.  Street or illegal drugs are very harmful to the baby. They are absolutely forbidden. A baby born to an addicted mother will be addicted at birth. The baby will go through the same withdrawal an adult does. SEEK MEDICAL CARE IF:  You have any concerns or worries during your pregnancy. It is better to call with your questions if you feel they cannot wait, rather  than worry about them. SEEK IMMEDIATE MEDICAL CARE IF:   An unexplained oral temperature above 102 F (38.9 C) develops, or as your caregiver suggests.  You have leaking of fluid from the vagina (birth canal). If leaking membranes are suspected, take your temperature and tell your caregiver of this when you call.  There is vaginal spotting, bleeding, or passing clots. Tell your caregiver of the amount and how many pads are used. Light spotting in pregnancy is common, especially following intercourse.  You develop a bad smelling vaginal discharge with a change in the color from clear   to white.  You continue to feel sick to your stomach (nauseated) and have no relief from remedies suggested. You vomit blood or coffee ground-like materials.  You lose more than 2 pounds of weight or gain more than 2 pounds of weight over 1 week, or as suggested by your caregiver.  You notice swelling of your face, hands, feet, or legs.  You get exposed to German measles and have never had them.  You are exposed to fifth disease or chickenpox.  You develop belly (abdominal) pain. Round ligament discomfort is a common non-cancerous (benign) cause of abdominal pain in pregnancy. Your caregiver still must evaluate you.  You develop a bad headache that does not go away.  You develop fever, diarrhea, pain with urination, or shortness of breath.  You develop visual problems, blurry, or double vision.  You fall or are in a car accident or any kind of trauma.  There is mental or physical violence at home. Document Released: 09/08/2001 Document Revised: 12/07/2011 Document Reviewed: 03/13/2009 [employer] Patient Information 2013 [employer].  

## 2012-12-12 NOTE — Progress Notes (Signed)
Pulse: 98 Had one visit at [facility]. [facility]. Has some pelvic discomfort. Still is having some spotting. Also has a yeast infection, she used the otc cream but it didn't go away.

## 2012-12-13 ENCOUNTER — Encounter: Payer: Medicaid Other | Admitting: Family Medicine

## 2012-12-14 MED ORDER — METRONIDAZOLE 500 MG PO TABS: 500.0000 mg | ORAL_TABLET | Freq: Three times a day (TID) | ORAL | Status: DC

## 2012-12-14 MED ORDER — METRONIDAZOLE 500 MG PO TABS: 500.0000 mg | ORAL_TABLET | Freq: Two times a day (BID) | ORAL | Status: DC

## 2012-12-14 NOTE — Addendum Note (Signed)
Addended by: Caren Griffins C on: 12/14/2012 10:04 AM   Modules accepted: Orders

## 2012-12-19 ENCOUNTER — Telehealth: Payer: Self-pay | Admitting: *Deleted

## 2012-12-19 NOTE — Telephone Encounter (Signed)
Patient informed. She will pick up medicine today.

## 2012-12-19 NOTE — Telephone Encounter (Signed)
Message copied by Mannie Stabile on Mon Dec 19, 2012  4:47 PM ------      Message from: POE, DEIRDRE C      Created: Wed Dec 14, 2012  9:57 AM       Isent Flagyl rx to her pharmacy. Please call that trich and BV positive ------

## 2012-12-22 ENCOUNTER — Other Ambulatory Visit: Payer: Self-pay | Admitting: Family Medicine

## 2012-12-22 DIAGNOSIS — O09529 Supervision of elderly multigravida, unspecified trimester: Secondary | ICD-10-CM

## 2012-12-27 ENCOUNTER — Encounter (HOSPITAL_COMMUNITY): Payer: Self-pay

## 2012-12-27 ENCOUNTER — Ambulatory Visit (HOSPITAL_COMMUNITY)
Admission: RE | Admit: 2012-12-27 | Discharge: 2012-12-27 | Disposition: A | Discharge status: Home / Self Care | Payer: Medicaid Other | Source: Ambulatory Visit | Attending: Family Medicine | Admitting: Family Medicine

## 2012-12-27 DIAGNOSIS — O34219 Maternal care for unspecified type scar from previous cesarean delivery: Secondary | ICD-10-CM | POA: Insufficient documentation

## 2012-12-27 DIAGNOSIS — O99340 Other mental disorders complicating pregnancy, unspecified trimester: Secondary | ICD-10-CM | POA: Insufficient documentation

## 2012-12-27 DIAGNOSIS — F1010 Alcohol abuse, uncomplicated: Secondary | ICD-10-CM | POA: Insufficient documentation

## 2012-12-27 DIAGNOSIS — O99330 Smoking (tobacco) complicating pregnancy, unspecified trimester: Secondary | ICD-10-CM | POA: Insufficient documentation

## 2012-12-27 DIAGNOSIS — Z3689 Encounter for other specified antenatal screening: Secondary | ICD-10-CM | POA: Insufficient documentation

## 2012-12-27 DIAGNOSIS — O09529 Supervision of elderly multigravida, unspecified trimester: Secondary | ICD-10-CM | POA: Insufficient documentation

## 2013-01-09 ENCOUNTER — Other Ambulatory Visit: Payer: Self-pay | Admitting: Family Medicine

## 2013-01-09 ENCOUNTER — Ambulatory Visit (INDEPENDENT_AMBULATORY_CARE_PROVIDER_SITE_OTHER): Payer: Medicaid Other | Admitting: Family Medicine

## 2013-01-09 VITALS — BP 114/84 | Temp 98.4°F | Wt 177.0 lb

## 2013-01-09 DIAGNOSIS — O9989 Other specified diseases and conditions complicating pregnancy, childbirth and the puerperium: Secondary | ICD-10-CM

## 2013-01-09 DIAGNOSIS — Z3482 Encounter for supervision of other normal pregnancy, second trimester: Secondary | ICD-10-CM

## 2013-01-09 DIAGNOSIS — N898 Other specified noninflammatory disorders of vagina: Secondary | ICD-10-CM

## 2013-01-09 LAB — POCT URINALYSIS DIP (DEVICE)
Bilirubin Urine: NEGATIVE
Glucose, UA: NEGATIVE mg/dL
Hgb urine dipstick: NEGATIVE
Leukocytes, UA: NEGATIVE
Nitrite: NEGATIVE
Urobilinogen, UA: 0.2 mg/dL (ref 0.0–1.0)

## 2013-01-09 LAB — WET PREP, GENITAL
Trich, Wet Prep: NONE SEEN
Yeast Wet Prep HPF POC: NONE SEEN

## 2013-01-09 NOTE — Patient Instructions (Signed)
Pregnancy - Second Trimester The second trimester of pregnancy (3 to 6 months) is a period of rapid growth for you and your baby. At the end of the sixth month, your baby is about 9 inches long and weighs 1 1/2 pounds. You will begin to feel the baby move between 18 and 20 weeks of the pregnancy. This is called quickening. Weight gain is faster. A clear fluid (colostrum) may leak out of your breasts. You may feel small contractions of the womb (uterus). This is known as false labor or Braxton-Hicks contractions. This is like a practice for labor when the baby is ready to be born. Usually, the problems with morning sickness have usually passed by the end of your first trimester. Some women develop small dark blotches (called cholasma, mask of pregnancy) on their face that usually goes away after the baby is born. Exposure to the sun makes the blotches worse. Acne may also develop in some pregnant women and pregnant women who have acne, may find that it goes away. PRENATAL EXAMS  Blood work may continue to be done during prenatal exams. These tests are done to check on your health and the probable health of your baby. Blood work is used to follow your blood levels (hemoglobin). Anemia (low hemoglobin) is common during pregnancy. Iron and vitamins are given to help prevent this. You will also be checked for diabetes between 24 and 28 weeks of the pregnancy. Some of the previous blood tests may be repeated.  The size of the uterus is measured during each visit. This is to make sure that the baby is continuing to grow properly according to the dates of the pregnancy.  Your blood pressure is checked every prenatal visit. This is to make sure you are not getting toxemia.  Your urine is checked to make sure you do not have an infection, diabetes or protein in the urine.  Your weight is checked often to make sure gains are happening at the suggested rate. This is to ensure that both you and your baby are growing  normally.  Sometimes, an ultrasound is performed to confirm the proper growth and development of the baby. This is a test which bounces harmless sound waves off the baby so your caregiver can more accurately determine due dates. Sometimes, a specialized test is done on the amniotic fluid surrounding the baby. This test is called an amniocentesis. The amniotic fluid is obtained by sticking a needle into the belly (abdomen). This is done to check the chromosomes in instances where there is a concern about possible genetic problems with the baby. It is also sometimes done near the end of pregnancy if an early delivery is required. In this case, it is done to help make sure the baby's lungs are mature enough for the baby to live outside of the womb. CHANGES OCCURING IN THE SECOND TRIMESTER OF PREGNANCY Your body goes through many changes during pregnancy. They vary from person to person. Talk to your caregiver about changes you notice that you are concerned about.  During the second trimester, you will likely have an increase in your appetite. It is normal to have cravings for certain foods. This varies from person to person and pregnancy to pregnancy.  Your lower abdomen will begin to bulge.  You may have to urinate more often because the uterus and baby are pressing on your bladder. It is also common to get more bladder infections during pregnancy (pain with urination). You can help this by   drinking lots of fluids and emptying your bladder before and after intercourse.  You may begin to get stretch marks on your hips, abdomen, and breasts. These are normal changes in the body during pregnancy. There are no exercises or medications to take that prevent this change.  You may begin to develop swollen and bulging veins (varicose veins) in your legs. Wearing support hose, elevating your feet for 15 minutes, 3 to 4 times a day and limiting salt in your diet helps lessen the problem.  Heartburn may develop  as the uterus grows and pushes up against the stomach. Antacids recommended by your caregiver helps with this problem. Also, eating smaller meals 4 to 5 times a day helps.  Constipation can be treated with a stool softener or adding bulk to your diet. Drinking lots of fluids, vegetables, fruits, and whole grains are helpful.  Exercising is also helpful. If you have been very active up until your pregnancy, most of these activities can be continued during your pregnancy. If you have been less active, it is helpful to start an exercise program such as walking.  Hemorrhoids (varicose veins in the rectum) may develop at the end of the second trimester. Warm sitz baths and hemorrhoid cream recommended by your caregiver helps hemorrhoid problems.  Backaches may develop during this time of your pregnancy. Avoid heavy lifting, wear low heal shoes and practice good posture to help with backache problems.  Some pregnant women develop tingling and numbness of their hand and fingers because of swelling and tightening of ligaments in the wrist (carpel tunnel syndrome). This goes away after the baby is born.  As your breasts enlarge, you may have to get a bigger bra. Get a comfortable, cotton, support bra. Do not get a nursing bra until the last month of the pregnancy if you will be nursing the baby.  You may get a dark line from your belly button to the pubic area called the linea nigra.  You may develop rosy cheeks because of increase blood flow to the face.  You may develop spider looking lines of the face, neck, arms and chest. These go away after the baby is born. HOME CARE INSTRUCTIONS   It is extremely important to avoid all smoking, herbs, alcohol, and unprescribed drugs during your pregnancy. These chemicals affect the formation and growth of the baby. Avoid these chemicals throughout the pregnancy to ensure the delivery of a healthy infant.  Most of your home care instructions are the same as  suggested for the first trimester of your pregnancy. Keep your caregiver's appointments. Follow your caregiver's instructions regarding medication use, exercise and diet.  During pregnancy, you are providing food for you and your baby. Continue to eat regular, well-balanced meals. Choose foods such as meat, fish, milk and other low fat dairy products, vegetables, fruits, and whole-grain breads and cereals. Your caregiver will tell you of the ideal weight gain.  A physical sexual relationship may be continued up until near the end of pregnancy if there are no other problems. Problems could include early (premature) leaking of amniotic fluid from the membranes, vaginal bleeding, abdominal pain, or other medical or pregnancy problems.  Exercise regularly if there are no restrictions. Check with your caregiver if you are unsure of the safety of some of your exercises. The greatest weight gain will occur in the last 2 trimesters of pregnancy. Exercise will help you:  Control your weight.  Get you in shape for labor and delivery.  Lose weight   after you have the baby.  Wear a good support or jogging bra for breast tenderness during pregnancy. This may help if worn during sleep. Pads or tissues may be used in the bra if you are leaking colostrum.  Do not use hot tubs, steam rooms or saunas throughout the pregnancy.  Wear your seat belt at all times when driving. This protects you and your baby if you are in an accident.  Avoid raw meat, uncooked cheese, cat litter boxes and soil used by cats. These carry germs that can cause birth defects in the baby.  The second trimester is also a good time to visit your dentist for your dental health if this has not been done yet. Getting your teeth cleaned is OK. Use a soft toothbrush. Brush gently during pregnancy.  It is easier to loose urine during pregnancy. Tightening up and strengthening the pelvic muscles will help with this problem. Practice stopping your  urination while you are going to the bathroom. These are the same muscles you need to strengthen. It is also the muscles you would use as if you were trying to stop from passing gas. You can practice tightening these muscles up 10 times a set and repeating this about 3 times per day. Once you know what muscles to tighten up, do not perform these exercises during urination. It is more likely to contribute to an infection by backing up the urine.  Ask for help if you have financial, counseling or nutritional needs during pregnancy. Your caregiver will be able to offer counseling for these needs as well as refer you for other special needs.  Your skin may become oily. If so, wash your face with mild soap, use non-greasy moisturizer and oil or cream based makeup. MEDICATIONS AND DRUG USE IN PREGNANCY  Take prenatal vitamins as directed. The vitamin should contain 1 milligram of folic acid. Keep all vitamins out of reach of children. Only a couple vitamins or tablets containing iron may be fatal to a baby or young child when ingested.  Avoid use of all medications, including herbs, over-the-counter medications, not prescribed or suggested by your caregiver. Only take over-the-counter or prescription medicines for pain, discomfort, or fever as directed by your caregiver. Do not use aspirin.  Let your caregiver also know about herbs you may be using.  Alcohol is related to a number of birth defects. This includes fetal alcohol syndrome. All alcohol, in any form, should be avoided completely. Smoking will cause low birth rate and premature babies.  Street or illegal drugs are very harmful to the baby. They are absolutely forbidden. A baby born to an addicted mother will be addicted at birth. The baby will go through the same withdrawal an adult does. SEEK MEDICAL CARE IF:  You have any concerns or worries during your pregnancy. It is better to call with your questions if you feel they cannot wait, rather  than worry about them. SEEK IMMEDIATE MEDICAL CARE IF:   An unexplained oral temperature above 102 F (38.9 C) develops, or as your caregiver suggests.  You have leaking of fluid from the vagina (birth canal). If leaking membranes are suspected, take your temperature and tell your caregiver of this when you call.  There is vaginal spotting, bleeding, or passing clots. Tell your caregiver of the amount and how many pads are used. Light spotting in pregnancy is common, especially following intercourse.  You develop a bad smelling vaginal discharge with a change in the color from clear   to white.  You continue to feel sick to your stomach (nauseated) and have no relief from remedies suggested. You vomit blood or coffee ground-like materials.  You lose more than 2 pounds of weight or gain more than 2 pounds of weight over 1 week, or as suggested by your caregiver.  You notice swelling of your face, hands, feet, or legs.  You get exposed to [place] measles and have never had them.  You are exposed to fifth disease or chickenpox.  You develop belly (abdominal) pain. Round ligament discomfort is a common non-cancerous (benign) cause of abdominal pain in pregnancy. Your caregiver still must evaluate you.  You develop a bad headache that does not go away.  You develop fever, diarrhea, pain with urination, or shortness of breath.  You develop visual problems, blurry, or double vision.  You fall or are in a car accident or any kind of trauma.  There is mental or physical violence at home. Document Released: 09/08/2001 Document Revised: 12/07/2011 Document Reviewed: 03/13/2009 [facility] Patient Information 2013 [place], [place].  Sterilization Information, Female Female sterilization is a procedure to permanently prevent pregnancy. There are different ways to perform sterilization, but all either block or close the fallopian tubes so that your eggs cannot reach your uterus. If your egg cannot  reach your uterus, sperm cannot fertilize the egg, and you cannot get pregnant.  Sterilization is performed by a surgical procedure. Sometimes these procedures are performed in a hospital while a patient is asleep. Sometimes they can be done in a clinic setting with the patient awake. The fallopian tubes can be surgically cut, tied, or sealed through a procedure called tubal ligation. The fallopian tubes can also be closed with clips or rings. Sterilization can also be done by placing a tiny coil into each fallopian tube, which causes scar tissue to grow inside the tube. The scar tissue then blocks the tubes.  Discuss sterilization with your caregiver to answer any concerns you or your partner may have. You may want to ask what type of sterilization your caregiver performs. Some caregivers may not perform all the various options. Sterilization is permanent and should only be done if you are sure you do not want children or do not want any more children. Having a sterilization reversed may not be successful.  STERILIZATION PROCEDURES  Laparoscopic sterilization. This is a surgical method performed at a time other than right after childbirth. Two incisions are made in the lower abdomen. A thin, lighted tube (laparoscope) is inserted into one of the incisions and is used to perform the procedure. The fallopian tubes are closed with a ring or a clip. An instrument that uses heat could be used to seal the tubes closed (electrocautery).   Mini-laparotomy. This is a surgical method done 1 or 2 days after giving birth. Typically, a small incision is made just below the belly button (umbilicus) and the fallopian tubes are exposed. The tubes can then be sealed, tied, or cut.   Hysteroscopic sterilization. This is performed at a time other than right after childbirth. A tiny, spring-like coil is inserted through the cervix and uterus and placed into the fallopian tubes. The coil causes scaring and blocks the tubes.  Other forms of contraception should be used for 3 months after the procedure to allow the scar tissue to form completely. Additionally, it is required hysterosalpingography be done 3 months later to ensure that the procedure was successful. Hysterosalpingography is a procedure that uses X-rays to look at your  uterus and fallopian tubes after a material to make them show up better has been inserted. IS STERILIZATION SAFE? Sterilization is considered safe with very rare complications. Risks depend on the type of procedure you have. As with any surgical procedure, there are risks. Some risks of sterilization by any means include:   Bleeding.  Infection.  Reaction to anesthesia medicine.  Injury to surrounding organs. Risks specific to having hysteroscopic coils placed include:  The coils may not be placed correctly the first time.   The coils may move out of place.   The tubes may not get completely blocked after 3 months.   Injury to surrounding organs when placing the coil.  HOW EFFECTIVE IS FEMALE STERILIZATION? Sterilization is nearly 100% effective, but it can fail. Depending on the type of sterilization, the rate of failure can be as high as 3%. After hysteroscopic sterilization with placement of fallopian tube coils, you will need back-up birth control for 3 months after the procedure. Sterilization is effective for a lifetime.  BENEFITS OF STERILIZATION  It does not affect your hormones, and therefore will not affect your menstrual periods, sexual desire, or performance.   It is effective for a lifetime.   It is safe.   You do not need to worry about getting pregnant. Keep in mind that if you had the hysteroscopic placement procedure, you must wait 3 months after the procedure (or until your caregiver confirms) before pregnancy is not considered possible.   There are no side effects unlike other types of birth control (contraception).  DRAWBACKS OF  STERILIZATION  You must be sure you do not want children or any more children. The procedure is permanent.   It does not provide protection against sexually transmitted infections (STIs).   The tubes can grow back together. If this happens, there is a risk of pregnancy. There is also an increased risk (50%) of pregnancy being an ectopic pregnancy. This is a pregnancy that happens outside of the uterus. Document Released: 03/02/2008 Document Revised: 03/15/2012 Document Reviewed: 12/31/2011 [facility] Patient Information 2013 [place], [place].  Sexually Transmitted Disease Sexually transmitted disease (STD) refers to any infection that is passed from person to person during sexual activity. This may happen by way of saliva, semen, blood, vaginal mucus, or urine. Common STDs include:  Gonorrhea.  Chlamydia.  Syphilis.  HIV/AIDS.  Genital herpes.  Hepatitis B and C.  Trichomonas.  Human papillomavirus (HPV).  Pubic lice. CAUSES  An STD may be spread by bacteria, virus, or parasite. A person can get an STD by:  Sexual intercourse with an infected person.  Sharing sex toys with an infected person.  Sharing needles with an infected person.  Having intimate contact with the genitals, mouth, or rectal areas of an infected person. SYMPTOMS  Some people may not have any symptoms, but they can still pass the infection to others. Different STDs have different symptoms. Symptoms include:  Painful or bloody urination.  Pain in the pelvis, abdomen, vagina, anus, throat, or eyes.  Skin rash, itching, irritation, growths, or sores (lesions). These usually occur in the genital or anal area.  Abnormal vaginal discharge.  Penile discharge in men.  Soft, flesh-colored skin growths in the genital or anal area.  Fever.  Pain or bleeding during sexual intercourse.  Swollen glands in the groin area.  Yellow skin and eyes (jaundice). This is seen with hepatitis. DIAGNOSIS  To make  a diagnosis, your caregiver may:  Take a medical history.  Perform a physical  exam.  Take a specimen (culture) to be examined.  Examine a sample of discharge under a microscope.  Perform blood tests.  Perform a Pap test, if this applies.  Perform a colposcopy.  Perform a laparoscopy. TREATMENT   Chlamydia, gonorrhea, trichomonas, and syphilis can be cured with antibiotic medicine.  Genital herpes, hepatitis, and HIV can be treated, but not cured, with prescribed medicines. The medicines will lessen the symptoms.  Genital warts from HPV can be treated with medicine or by freezing, burning (electrocautery), or surgery. Warts may come back.  HPV is a virus and cannot be cured with medicine or surgery.However, abnormal areas may be followed very closely by your caregiver and may be removed from the cervix, vagina, or vulva through office procedures or surgery. If your diagnosis is confirmed, your recent sexual partners need treatment. This is true even if they are symptom-free or have a negative culture or evaluation. They should not have sex until their caregiver says it is okay. HOME CARE INSTRUCTIONS  All sexual partners should be informed, tested, and treated for all STDs.  Take your antibiotics as directed. Finish them even if you start to feel better.  Only take over-the-counter or prescription medicines for pain, discomfort, or fever as directed by your caregiver.  Rest.  Eat a balanced diet and drink enough fluids to keep your urine clear or pale yellow.  Do not have sex until treatment is completed and you have followed up with your caregiver. STDs should be checked after treatment.  Keep all follow-up appointments, Pap tests, and blood tests as directed by your caregiver.  Only use latex condoms and water-soluble lubricants during sexual activity. Do not use petroleum jelly or oils.  Avoid alcohol and illegal drugs.  Get vaccinated for HPV and hepatitis. If you  have not received these vaccines in the past, talk to your caregiver about whether one or both might be right for you.  Avoid risky sex practices that can break the skin. The only way to avoid getting an STD is to avoid all sexual activity.Latex condoms and dental dams (for oral sex) will help lessen the risk of getting an STD, but will not completely eliminate the risk. SEEK MEDICAL CARE IF:   You have a fever.  You have any new or worsening symptoms. Document Released: 12/05/2002 Document Revised: 12/07/2011 Document Reviewed: 12/12/2010 [facility] Patient Information 2013 [place], [place].

## 2013-01-09 NOTE — Progress Notes (Signed)
Pulse 89 C/o yellow d/c with odor and smell.

## 2013-01-09 NOTE — Progress Notes (Signed)
Vaginal discharge, odor, mild itching. Treated for trich/BV 1 month ago. No bleeding or cramping. Repeat wet prep and GC today. Smoking - has cut back to 1-2 cigarettes a day.  Staying at Room at the [facility]. Wants BTL. Sign papers today.

## 2013-01-11 NOTE — Progress Notes (Signed)
30 day BTL Paperwork signed and scanned 01/09/13

## 2013-01-15 ENCOUNTER — Other Ambulatory Visit: Payer: Self-pay | Admitting: Family Medicine

## 2013-01-15 MED ORDER — METRONIDAZOLE 500 MG PO TABS: 500.0000 mg | ORAL_TABLET | Freq: Two times a day (BID) | ORAL | Status: DC

## 2013-01-16 ENCOUNTER — Encounter: Payer: Self-pay | Admitting: Obstetrics and Gynecology

## 2013-01-16 ENCOUNTER — Telehealth: Payer: Self-pay | Admitting: Obstetrics and Gynecology

## 2013-01-16 NOTE — Telephone Encounter (Addendum)
Message copied by Toula Moos on Mon Jan 16, 2013  4:39 PM   ------ Called patient and notified of result and Rx sent. Patient agrees and satisfied.       Message from: FERRY, [place]      Created: Sun Jan 15, 2013  7:56 AM       BV - rx sent for metronidazole. Please advise pt - staying at Room at the [facility]. Thanks. ------

## 2013-01-19 ENCOUNTER — Other Ambulatory Visit: Payer: Self-pay

## 2013-01-19 MED ORDER — METRONIDAZOLE 500 MG PO TABS: 500.0000 mg | ORAL_TABLET | Freq: Two times a day (BID) | ORAL | Status: AC

## 2013-01-19 NOTE — Telephone Encounter (Signed)
Pt. Called because she was told her RX would be at [facility] on [address] and it wasn't.  I was able to electronically send over the RX for Flagyl to [facility].  Called to inform patient and was unable to reach patient.  Left a message with staff member at [facility] that the medication was available to be picked up.  He voiced understanding and had no questions.

## 2013-02-02 ENCOUNTER — Other Ambulatory Visit: Payer: Self-pay | Admitting: Family Medicine

## 2013-02-02 DIAGNOSIS — O09529 Supervision of elderly multigravida, unspecified trimester: Secondary | ICD-10-CM

## 2013-02-04 ENCOUNTER — Encounter (HOSPITAL_COMMUNITY): Payer: Self-pay | Admitting: *Deleted

## 2013-02-04 ENCOUNTER — Inpatient Hospital Stay (HOSPITAL_COMMUNITY)
Admission: AD | Admit: 2013-02-04 | Discharge: 2013-02-10 | DRG: 765 | Disposition: A | Discharge status: Home / Self Care | Payer: Medicaid Other | Source: Ambulatory Visit | Attending: Obstetrics & Gynecology | Admitting: Obstetrics & Gynecology

## 2013-02-04 DIAGNOSIS — O34219 Maternal care for unspecified type scar from previous cesarean delivery: Secondary | ICD-10-CM

## 2013-02-04 DIAGNOSIS — Z302 Encounter for sterilization: Secondary | ICD-10-CM

## 2013-02-04 DIAGNOSIS — O4290 Premature rupture of membranes, unspecified as to length of time between rupture and onset of labor, unspecified weeks of gestation: Secondary | ICD-10-CM | POA: Diagnosis present

## 2013-02-04 DIAGNOSIS — O09529 Supervision of elderly multigravida, unspecified trimester: Secondary | ICD-10-CM | POA: Diagnosis present

## 2013-02-04 DIAGNOSIS — A609 Anogenital herpesviral infection, unspecified: Secondary | ICD-10-CM

## 2013-02-04 DIAGNOSIS — O99312 Alcohol use complicating pregnancy, second trimester: Secondary | ICD-10-CM

## 2013-02-04 DIAGNOSIS — Z98891 History of uterine scar from previous surgery: Secondary | ICD-10-CM

## 2013-02-04 LAB — WET PREP, GENITAL: Yeast Wet Prep HPF POC: NONE SEEN

## 2013-02-04 MED ORDER — SODIUM CHLORIDE 0.9 % IV SOLN
250.0000 mg | Freq: Four times a day (QID) | INTRAVENOUS | Status: AC
  Administered 2013-02-05 – 2013-02-06 (×8): 250 mg via INTRAVENOUS
  Filled 2013-02-04 (×8): qty 250

## 2013-02-04 MED ORDER — ZOLPIDEM TARTRATE 5 MG PO TABS
5.0000 mg | ORAL_TABLET | Freq: Every evening | ORAL | Status: DC | PRN
  Administered 2013-02-07: 5 mg via ORAL
  Filled 2013-02-04: qty 1

## 2013-02-04 MED ORDER — AMOXICILLIN 500 MG PO CAPS
500.0000 mg | ORAL_CAPSULE | Freq: Three times a day (TID) | ORAL | Status: DC
  Administered 2013-02-06 – 2013-02-10 (×10): 500 mg via ORAL
  Filled 2013-02-04 (×15): qty 1

## 2013-02-04 MED ORDER — BETAMETHASONE SOD PHOS & ACET 6 (3-3) MG/ML IJ SUSP
12.0000 mg | INTRAMUSCULAR | Status: AC
  Administered 2013-02-04 – 2013-02-05 (×2): 12 mg via INTRAMUSCULAR
  Filled 2013-02-04 (×2): qty 2

## 2013-02-04 MED ORDER — PRENATAL MULTIVITAMIN CH
1.0000 | ORAL_TABLET | Freq: Every day | ORAL | Status: DC
  Administered 2013-02-05 – 2013-02-06 (×2): 1 via ORAL
  Filled 2013-02-04 (×3): qty 1

## 2013-02-04 MED ORDER — DOCUSATE SODIUM 100 MG PO CAPS
100.0000 mg | ORAL_CAPSULE | Freq: Every day | ORAL | Status: DC
  Administered 2013-02-05 – 2013-02-10 (×5): 100 mg via ORAL
  Filled 2013-02-04 (×6): qty 1

## 2013-02-04 MED ORDER — ERYTHROMYCIN BASE 250 MG PO TABS
250.0000 mg | ORAL_TABLET | Freq: Four times a day (QID) | ORAL | Status: DC
  Administered 2013-02-06 – 2013-02-10 (×13): 250 mg via ORAL
  Filled 2013-02-04 (×19): qty 1

## 2013-02-04 MED ORDER — CALCIUM CARBONATE ANTACID 500 MG PO CHEW
2.0000 | CHEWABLE_TABLET | ORAL | Status: DC | PRN
  Administered 2013-02-09: 400 mg via ORAL
  Filled 2013-02-04: qty 2
  Filled 2013-02-04 (×2): qty 1

## 2013-02-04 MED ORDER — MAGNESIUM SULFATE 40 G IN LACTATED RINGERS - SIMPLE
2.0000 g/h | INTRAVENOUS | Status: DC
  Administered 2013-02-04: 2 g/h via INTRAVENOUS
  Filled 2013-02-04: qty 500

## 2013-02-04 MED ORDER — SODIUM CHLORIDE 0.9 % IV SOLN
2.0000 g | Freq: Four times a day (QID) | INTRAVENOUS | Status: AC
  Administered 2013-02-04 – 2013-02-06 (×8): 2 g via INTRAVENOUS
  Filled 2013-02-04 (×8): qty 2000

## 2013-02-04 MED ORDER — LACTATED RINGERS IV SOLN
INTRAVENOUS | Status: DC
  Administered 2013-02-04 – 2013-02-05 (×3): via INTRAVENOUS
  Administered 2013-02-05: 500 mL/h via INTRAVENOUS
  Administered 2013-02-05 – 2013-02-07 (×5): via INTRAVENOUS

## 2013-02-04 MED ORDER — ACETAMINOPHEN 325 MG PO TABS
650.0000 mg | ORAL_TABLET | ORAL | Status: DC | PRN
  Administered 2013-02-06: 650 mg via ORAL
  Filled 2013-02-04: qty 2

## 2013-02-04 MED ORDER — MAGNESIUM SULFATE BOLUS VIA INFUSION
4.0000 g | Freq: Once | INTRAVENOUS | Status: AC
  Administered 2013-02-04: 4 g via INTRAVENOUS
  Filled 2013-02-04: qty 500

## 2013-02-04 NOTE — MAU Provider Note (Signed)
History     CSN: [id number]  Arrival date and time: 02/04/13 2039   None     Chief Complaint  Patient presents with  . Rupture of Membranes   HPI Stephanie Warren is a 42 y.o. [phone] female at [redacted]w[redacted]d who presents w/ report of leakage of clear fluid since Wed 5/17 @ 5pm, states when she got here and took off pad there was pinkish fluid on it.  Reports good fm.  Denies uc's or vb.   Next appt in clinic Monday.   OB History   Grav Para Term Preterm Abortions TAB SAB Ect Mult Living   5 1 1  3 2 1   1       Past Medical History  Diagnosis Date  . Genital HSV   . Abnormal Pap smear   . Infection     yeast  . Asthma     Past Surgical History  Procedure Laterality Date  . Cesarean section    . Dilation and curettage of uterus      x3  . Rib fracture surgery      left side / 10 ribs broken. Horse back riding accident.  . Cyst excision perineal    . Bartholin gland cyst excision    . Fracture surgery    . Lung surgery      left lung puncture    Family History  Problem Relation Age of Onset  . Hypertension Father   . Hyperlipidemia Father   . Cancer Father     liver, lung, bone  . Asthma Father   . Alcohol abuse Father   . Hypertension Maternal Grandfather   . Hyperlipidemia Maternal Grandfather   . Cancer Maternal Grandfather     lung  . Stroke Maternal Grandfather   . Hypertension Paternal Grandfather   . Hyperlipidemia Paternal Grandfather   . Cancer Paternal Grandfather     lung and liver  . Stroke Paternal Grandfather   . Drug abuse Mother     History  Substance Use Topics  . Smoking status: Current Every Day Smoker -- 0.25 packs/day    Types: Cigarettes  . Smokeless tobacco: Not on file  . Alcohol Use: Yes     Comment: Beer . Maybe 4 amonth    Allergies:  Allergies  Allergen Reactions  . Codeine Itching and Nausea And Vomiting    Prescriptions prior to admission  Medication Sig Dispense Refill  . albuterol (PROVENTIL HFA;VENTOLIN HFA) 108  (90 BASE) MCG/ACT inhaler Inhale 2 puffs into the lungs every 4 (four) hours as needed for wheezing.  1 Inhaler  0  . Prenatal Vit-Fe Fumarate-FA (PRENATAL MULTIVITAMIN) TABS Take 1 tablet by mouth every morning.        Review of Systems  Constitutional: Negative.  Negative for fever and chills.  HENT: Negative.   Eyes: Negative.   Respiratory: Negative.   Cardiovascular: Negative.   Gastrointestinal: Negative.   Genitourinary: Negative.   Musculoskeletal: Negative.   Skin: Negative.   Neurological: Negative.   Endo/Heme/Allergies: Negative.   Psychiatric/Behavioral: Negative.    Physical Exam   Blood pressure 111/72, pulse 88, temperature 98.4 F (36.9 C), temperature source Oral, resp. rate 18, height 5\' 5"  (1.651 m), weight 82.555 kg (182 lb), last menstrual period 07/25/2012.  Physical Exam  Constitutional: She is oriented to person, place, and time. She appears well-developed and well-nourished.  HENT:  Head: Normocephalic.  Neck: Normal range of motion.  Cardiovascular: Normal rate.   Respiratory: Effort normal.  GI: Soft.  gravid  Musculoskeletal: Normal range of motion.  Neurological: She is alert and oriented to person, place, and time. She has normal reflexes.  Skin: Skin is warm and dry.  Psychiatric: She has a normal mood and affect. Her behavior is normal. Judgment and thought content normal.    MAU Course  Procedures  EFM Spec exam- pos pooling of fluid +fern  Assessment and Plan  Please refer to H&P   Marge Duncans 02/04/2013, 9:35 PM

## 2013-02-04 NOTE — H&P (Signed)
Stephanie Warren is a 42 y.o. [phone] female at [redacted]w[redacted]d by LMP which correlates well w/ 18.5wk u/s, presenting w/ report of leakage of clear fluid since Wed 5/7 @ 1700.  States when she got here and took off pad there was pinkish fluid on it. Reports good fm. Denies uc's or vb. Initiated pnc @ [facility] @ 12wks, transferred to [facility] @ 16wks. Last etoh on new year's eve. [facility]. H/O previous c/s for arrest of descent- desires repeat c/s w/ BTL.  Recently treated for BV and trichomonas.   . Maternal Medical History:  Reason for admission: Rupture of membranes.   Contractions: None perceived  Fetal activity: Perceived fetal activity is normal.   Last perceived fetal movement was within the past hour.    Prenatal complications: Bleeding (earlier in pregnancy- resolved) and substance abuse (etoh use in pregnancy- [facility] the [facility] now).   Prenatal Complications - Diabetes: none.    OB History   Grav Para Term Preterm Abortions TAB SAB Ect Mult Living   5 1 1  3 2 1   1      Past Medical History  Diagnosis Date  . Genital HSV   . Abnormal Pap smear   . Infection     yeast  . Asthma    Past Surgical History  Procedure Laterality Date  . Cesarean section    . Dilation and curettage of uterus      x3  . Rib fracture surgery      left side / 10 ribs broken. Horse back riding accident.  . Cyst excision perineal    . Bartholin gland cyst excision    . Fracture surgery    . Lung surgery      left lung puncture   Family History: family history includes Alcohol abuse in her father; Asthma in her father; Cancer in her father, maternal grandfather, and paternal grandfather; Drug abuse in her mother; Hyperlipidemia in her father, maternal grandfather, and paternal grandfather; Hypertension in her father, maternal grandfather, and paternal grandfather; and Stroke in her maternal grandfather and paternal grandfather. Social History:  reports that she has been smoking  Cigarettes.  She has been smoking about 0.25 packs per day. She does not have any smokeless tobacco history on file. She reports that  drinks alcohol. She reports that she does not use illicit drugs.  Review of Systems  Constitutional: Negative.  Negative for fever and chills.  HENT: Negative.   Eyes: Negative.   Respiratory: Negative.   Cardiovascular: Negative.   Gastrointestinal: Negative.   Genitourinary: Negative.   Musculoskeletal: Negative.   Skin: Negative.   Neurological: Negative.   Endo/Heme/Allergies: Negative.   Psychiatric/Behavioral: Negative.       Blood pressure 111/72, pulse 88, temperature 98.4 F (36.9 C), temperature source Oral, resp. rate 18, height 5\' 5"  (1.651 m), weight 82.555 kg (182 lb), last menstrual period 07/25/2012. Maternal Exam:  Uterine Assessment: Contraction strength is mild.  Contraction frequency is regular.   Abdomen: Patient reports no abdominal tenderness. Surgical scars: low transverse.   Introitus: Normal vulva. Normal vagina.  Ferning test: positive.  Amniotic fluid character: clear.  Cervix: Cervix evaluated by sterile speculum exam.     Physical Exam  Constitutional: She is oriented to person, place, and time. She appears well-developed and well-nourished.  HENT:  Head: Normocephalic.  Neck: Normal range of motion.  Cardiovascular: Normal rate and regular rhythm.   Respiratory: Effort normal and breath sounds  normal.  GI: Soft.  gravid  Genitourinary: Vagina normal and uterus normal.  Spec exam: copious amount of pink-tinged fluid in posterior fornix, cx visually closed  SVE: deferred  Musculoskeletal: Normal range of motion.  Neurological: She is alert and oriented to person, place, and time. She has normal reflexes.  Skin: Skin is warm and dry.  Psychiatric: She has a normal mood and affect. Her behavior is normal. Judgment and thought content normal.    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 Marice Potter Notified NICU charge d/t increased census Marge Duncans 02/04/2013, 10:15 PM

## 2013-02-04 NOTE — MAU Note (Signed)
Pt G5 P1 at 27.5wks leaking clear fluid x 3 days.  Denies cramping or contractions.

## 2013-02-04 NOTE — MAU Note (Signed)
Pt reports excessive leaking of fluid from vagina. 2 days ago pt reports gushing of fluid and has been persisting ever since. In MAU pt noted some pink tinged fluid.

## 2013-02-05 ENCOUNTER — Inpatient Hospital Stay (HOSPITAL_COMMUNITY): Payer: Medicaid Other

## 2013-02-05 LAB — CBC
HCT: 34.1 % — ABNORMAL LOW (ref 36.0–46.0)
Hemoglobin: 11.5 g/dL — ABNORMAL LOW (ref 12.0–15.0)
MCH: 31.9 pg (ref 26.0–34.0)
MCHC: 33.7 g/dL (ref 30.0–36.0)
MCV: 94.5 fL (ref 78.0–100.0)
RBC: 3.61 MIL/uL — ABNORMAL LOW (ref 3.87–5.11)

## 2013-02-05 LAB — TYPE AND SCREEN

## 2013-02-05 LAB — ABO/RH: ABO/RH(D): A POS

## 2013-02-05 MED ORDER — CITRIC ACID-SODIUM CITRATE 334-500 MG/5ML PO SOLN
ORAL | Status: AC
  Filled 2013-02-05: qty 15

## 2013-02-05 MED ORDER — ALBUTEROL SULFATE HFA 108 (90 BASE) MCG/ACT IN AERS
2.0000 | INHALATION_SPRAY | RESPIRATORY_TRACT | Status: DC | PRN
  Filled 2013-02-05: qty 6.7

## 2013-02-05 MED ORDER — PROMETHAZINE HCL 25 MG/ML IJ SOLN
12.5000 mg | Freq: Four times a day (QID) | INTRAMUSCULAR | Status: DC | PRN
  Administered 2013-02-05: 12.5 mg via INTRAMUSCULAR
  Filled 2013-02-05: qty 1

## 2013-02-05 MED ORDER — BUTORPHANOL TARTRATE 1 MG/ML IJ SOLN
1.0000 mg | Freq: Once | INTRAMUSCULAR | Status: AC
  Administered 2013-02-05: 1 mg via INTRAVENOUS
  Filled 2013-02-05: qty 1

## 2013-02-05 MED ORDER — VALACYCLOVIR HCL 500 MG PO TABS
500.0000 mg | ORAL_TABLET | Freq: Two times a day (BID) | ORAL | Status: DC
  Administered 2013-02-05 – 2013-02-10 (×10): 500 mg via ORAL
  Filled 2013-02-05 (×13): qty 1

## 2013-02-05 MED ORDER — BUTORPHANOL TARTRATE 1 MG/ML IJ SOLN: 1.0000 mg | Freq: Once | INTRAMUSCULAR | Status: DC

## 2013-02-05 MED ORDER — BUTORPHANOL TARTRATE 1 MG/ML IJ SOLN
1.0000 mg | Freq: Once | INTRAMUSCULAR | Status: AC
  Administered 2013-02-05: 1 mg via INTRAMUSCULAR
  Filled 2013-02-05: qty 1

## 2013-02-05 NOTE — Progress Notes (Signed)
CSW came to meet with pt however pt was resting & asked this CSW to come back at a later time.  CSW will return to complete full assessment when pt wakes up.

## 2013-02-05 NOTE — Progress Notes (Signed)
Patient ID: Stephanie Warren, female   DOB: 01-30-1971, 42 y.o.   MRN: [id number] HD #2  42 yo lady at 27.[redacted] weeks EGA admitted on 02-04-13 with PPROM for the last 3 days. Today she continues to have some contractions but denies any other type of uterine pain. Some FM. She denies VB.  O. VSS, AF     FHR- category 1     Toco- no contractions seen, palpated very mildly (stadol/phenergan helped)     Abd- gravid, non-tender  A/P. PPROM at 27.[redacted] weeks EGA- stable     Cont amp/erythro     RLTCS and PPS for s/sx of chorio     Social work consult, UDS

## 2013-02-05 NOTE — Consult Note (Signed)
Neonatology Consult Note:  At the request of the patients obstetrician Dr. Marice Potter I met with Ms. Kaufman who is a 42 y.o. G32P1031 female at [redacted]w[redacted]d by LMP which correlates well w/ 18.5wk u/s who presented with PPROM since Wed 5/7 @ 1700. [facility] inn.  She is due for second dose of BMZ this evening.  On amp / erythro.  History of prior c-sec (will be repeat c-sec if signs of chorio).    We discussed morbidity/mortality at this gestional age, delivery room resuscitation, including intubation and surfactant in DR.  Discussed mechanical ventilation and risk for chronic lung disease, risk for IVH with potential for motor / cognitive deficits, ROP, NEC, sepsis, as well as temperature instability and feeding immaturity.  Discussed NG / OG feeds, benefits of MBM in reducing incidence of NEC.   Discussed likely length of stay. Thank you for allowing [place] to participate in her care.  Please call with questions.  John Giovanni, DO   Neonatologist  The total length of face-to-face or floor / unit time for this encounter was 25 minutes.  Counseling and / or coordination of care was greater than fifty percent of the time and consisted of 20 minutes.

## 2013-02-05 NOTE — Progress Notes (Signed)
Patient ID: Stephanie Warren, female   DOB: 1971/02/28, 42 y.o.   MRN: [id number]   S:  Called to pt room due to 8/10 pain with contractions and variable decels with every ctx. Pt states ctx are 25-30 seconds every 8-10 minutes. Would like more pain medications.  O: Filed Vitals:   02/05/13 1300 02/05/13 1400 02/05/13 1500 02/05/13 1600  BP: 101/48 102/64 97/54 112/68  Pulse: 98 76 72 64  Temp:    98.2 F (36.8 C)  TempSrc:    Oral  Resp: 18 20 18 18   Height:      Weight:        Sterile spec exam:  Red-tinged watery fluid pooling in vagina. Cervix 0.5-1 cm open. No fetal parts or membranes visible.  FHTs:  Baseline: 130-140 Variability:  Min to fair (at times 1-6 bpm)   Accelerations:  occasional 10x10 accel Decelerations:  Variable to 70-90 x 1 min every 8-10 min with ctx.   TOCO:  q 8-12 min  A/P 42 y.o. G5P1031 at [redacted]w[redacted]d with PPROM, contractions, prior c-section -  Afebrile, non-tender. Fluid is bloody-tinged. Sono last night showed cord presenting and head oblique -  Trendelenberg, bed pan -  Continue stadol for pain -  Hope to buy another 24 hours to get 2nd dose of BMZ in. S/p mag x 12 hours -  To c-section FHTs become non-reassuring or labor progressing  Napoleon Form, MD 02/05/2013 4:50 PM

## 2013-02-06 ENCOUNTER — Encounter: Payer: Medicaid Other | Admitting: Family

## 2013-02-06 DIAGNOSIS — O34219 Maternal care for unspecified type scar from previous cesarean delivery: Secondary | ICD-10-CM

## 2013-02-06 LAB — GC/CHLAMYDIA PROBE AMP
CT Probe RNA: NEGATIVE
GC Probe RNA: NEGATIVE

## 2013-02-06 NOTE — Progress Notes (Signed)
Ur chart review completed.  

## 2013-02-06 NOTE — Progress Notes (Signed)
Stephanie Warren was in good spirits during our visit.  She is remaining positive and hopeful that she will be able to keep her baby in a little longer.  We spoke about her experience of being a mother to her 41 year old son (and other family members she has cared for) and her anticipation of being a mother to this baby.    I provided spiritual companionship and support.  [address] [employer] Pager, [phone] 2:02 PM  02/06/13 1400  Clinical Encounter Type  Visited With Patient  Visit Type Spiritual support  Referral From Nurse

## 2013-02-06 NOTE — Progress Notes (Signed)
Patient ID: Stephanie Warren, female   DOB: [birth year], 42 y.o.   MRN: [id number]  FACULTY PRACTICE ANTEPARTUM COMPREHENSIVE PROGRESS NOTE  Stephanie Warren is a 36 y.o. G5P1031 at [redacted]w[redacted]d  who is admitted for PPROM starting 5/7 at 17:00.  Estimated Date of Delivery: 05/01/13 Fetal presentation is oblique with head in RLQ by sono 5/11.  Length of Stay:  2 Days. 02/04/2013  Subjective: Pt had painful contractions (8/10) yesterday every 6-10 minutes. These have quieted down and are less painful and less frequent now. Baby is not moving much this morning but is usually quiet in early AM. She reports occasional uterine contractions, decreased fluid loss per vagina since being in [place] position. Per RN still has blood on TP when wiping.  Vitals:  Blood pressure 84/61, pulse 71, temperature 98.2 F (36.8 C), temperature source Oral, resp. rate 18, height 5\' 5"  (1.651 m), weight 82.555 kg (182 lb), last menstrual period 07/25/2012. Physical Examination: GEN:  Alert, no distress CV: RRR, no murmur RESP:  CTAB ABD:  Non-tender Cervical Exam: Not evaluated.  Extremities: extremities normal, atraumatic, no cyanosis or edema  Membranes:intact, ruptured  Fetal Monitoring:  Baseline: 135 bpm, Variability: fair 1-5 bpm), Accelerations: Non-reactive but appropriate for gestational age, Decelerations: Variable: repetitive, every 10-12 minutes, some severe to 60s to 90s, others milder, around 1 minute with good recovery.  Labs:  Results for orders placed during the hospital encounter of 02/04/13 (from the past 24 hour(s))  CBC   Collection Time    02/05/13  1:11 PM      Result Value Range   WBC 21.3 (*) 4.0 - 10.5 K/uL   RBC 3.61 (*) 3.87 - 5.11 MIL/uL   Hemoglobin 11.5 (*) 12.0 - 15.0 g/dL   HCT 56.2 (*) 13.0 - 86.5 %   MCV 94.5  78.0 - 100.0 fL   MCH 31.9  26.0 - 34.0 pg   MCHC 33.7  30.0 - 36.0 g/dL   RDW 78.4  69.6 - 29.5 %   Platelets 305  150 - 400 K/uL    Imaging Studies:    None  new  Medications:  Scheduled . ampicillin (OMNIPEN) IV  2 g Intravenous Q6H   Followed by  . amoxicillin  500 mg Oral Q8H  . butorphanol  1 mg Intravenous Once  . docusate sodium  100 mg Oral Daily  . erythromycin  250 mg Intravenous Q6H   Followed by  . erythromycin  250 mg Oral Q6H  . prenatal multivitamin  1 tablet Oral Q1200  . valACYclovir  500 mg Oral BID   I have reviewed the patient's current medications.  ASSESSMENT: Patient Active Problem List   Diagnosis Date Noted  . Elderly multigravida with antepartum condition or complication 11/15/2012  . Supervision of other normal pregnancy 11/15/2012  . Previous cesarean delivery, antepartum condition or complication 11/15/2012  . Uterine fibroid in antepartum period 11/15/2012  . HSV (herpes simplex virus) anogenital infection 11/15/2012  . Tobacco use complicating pregnancy 11/15/2012  . Active smoker 11/15/2012  . Alcohol use complicating pregnancy  11/15/2012   42 y.o. G5P1031 at [redacted]w[redacted]d with PPROM  PLAN: S/p BMZ x 2 as of 11 PM last night. S/p 12 hours Mag for neuroprotection Contractions are less intense and less frequent today Repetitive variables, decent variability and some accels between variables. Continues to leak fluid/some blood, afebrile, non-tender Deliver by repeat c-section for signs of fetal distress, chorio or advancing labor. Continue routine antenatal care.  Napoleon Form, MD 02/06/2013  7:20 AM

## 2013-02-07 ENCOUNTER — Encounter (HOSPITAL_COMMUNITY): Payer: Self-pay | Admitting: Anesthesiology

## 2013-02-07 ENCOUNTER — Encounter (HOSPITAL_COMMUNITY)
Admission: AD | Disposition: A | Discharge status: Home / Self Care | Payer: Self-pay | Source: Ambulatory Visit | Attending: Obstetrics & Gynecology

## 2013-02-07 ENCOUNTER — Ambulatory Visit (HOSPITAL_COMMUNITY): Admission: RE | Admit: 2013-02-07 | Payer: Medicaid Other | Source: Ambulatory Visit

## 2013-02-07 ENCOUNTER — Inpatient Hospital Stay (HOSPITAL_COMMUNITY): Payer: Medicaid Other | Admitting: Anesthesiology

## 2013-02-07 DIAGNOSIS — Z302 Encounter for sterilization: Secondary | ICD-10-CM

## 2013-02-07 DIAGNOSIS — O09529 Supervision of elderly multigravida, unspecified trimester: Secondary | ICD-10-CM

## 2013-02-07 DIAGNOSIS — O4290 Premature rupture of membranes, unspecified as to length of time between rupture and onset of labor, unspecified weeks of gestation: Secondary | ICD-10-CM

## 2013-02-07 LAB — URINE DRUGS OF ABUSE SCREEN W ALC, ROUTINE (REF LAB)
Barbiturate Quant, Ur: NEGATIVE
Benzodiazepines.: NEGATIVE
Ethyl Alcohol: <10 mg/dL (ref <10)
Marijuana Metabolite: NEGATIVE
Methadone: NEGATIVE
Phencyclidine (PCP): NEGATIVE
Propoxyphene: NEGATIVE

## 2013-02-07 LAB — TYPE AND SCREEN: ABO/RH(D): A POS

## 2013-02-07 SURGERY — Surgical Case
Anesthesia: Spinal | Wound class: Clean Contaminated

## 2013-02-07 MED ORDER — SODIUM CHLORIDE 0.9 % IJ SOLN: 3.0000 mL | INTRAMUSCULAR | Status: DC | PRN

## 2013-02-07 MED ORDER — KETOROLAC TROMETHAMINE 30 MG/ML IJ SOLN: 30.0000 mg | Freq: Four times a day (QID) | INTRAMUSCULAR | Status: AC | PRN

## 2013-02-07 MED ORDER — DIBUCAINE 1 % RE OINT: 1.0000 "application " | TOPICAL_OINTMENT | RECTAL | Status: DC | PRN

## 2013-02-07 MED ORDER — WITCH HAZEL-GLYCERIN EX PADS: 1.0000 "application " | MEDICATED_PAD | CUTANEOUS | Status: DC | PRN

## 2013-02-07 MED ORDER — OXYTOCIN 10 UNIT/ML IJ SOLN
40.0000 [IU] | INTRAVENOUS | Status: DC | PRN
  Administered 2013-02-07: 40 [IU] via INTRAVENOUS

## 2013-02-07 MED ORDER — PHENYLEPHRINE 40 MCG/ML (10ML) SYRINGE FOR IV PUSH (FOR BLOOD PRESSURE SUPPORT)
PREFILLED_SYRINGE | INTRAVENOUS | Status: AC
  Filled 2013-02-07: qty 5

## 2013-02-07 MED ORDER — KETOROLAC TROMETHAMINE 60 MG/2ML IM SOLN: 60.0000 mg | Freq: Once | INTRAMUSCULAR | Status: AC | PRN

## 2013-02-07 MED ORDER — BUPIVACAINE HCL (PF) 0.5 % IJ SOLN
INTRAMUSCULAR | Status: AC
  Filled 2013-02-07: qty 30

## 2013-02-07 MED ORDER — TETANUS-DIPHTH-ACELL PERTUSSIS 5-2.5-18.5 LF-MCG/0.5 IM SUSP: 0.5000 mL | Freq: Once | INTRAMUSCULAR | Status: DC

## 2013-02-07 MED ORDER — 0.9 % SODIUM CHLORIDE (POUR BTL) OPTIME
TOPICAL | Status: DC | PRN
  Administered 2013-02-07: 1000 mL

## 2013-02-07 MED ORDER — OXYTOCIN 40 UNITS IN LACTATED RINGERS INFUSION - SIMPLE MED: 62.5000 mL/h | INTRAVENOUS | Status: AC

## 2013-02-07 MED ORDER — CEFAZOLIN SODIUM-DEXTROSE 2-3 GM-% IV SOLR
INTRAVENOUS | Status: AC
  Filled 2013-02-07: qty 50

## 2013-02-07 MED ORDER — DIPHENHYDRAMINE HCL 50 MG/ML IJ SOLN: 12.5000 mg | INTRAMUSCULAR | Status: DC | PRN

## 2013-02-07 MED ORDER — KETOROLAC TROMETHAMINE 30 MG/ML IJ SOLN: 15.0000 mg | Freq: Once | INTRAMUSCULAR | Status: DC | PRN

## 2013-02-07 MED ORDER — CEFAZOLIN SODIUM-DEXTROSE 2-3 GM-% IV SOLR
INTRAVENOUS | Status: DC | PRN
  Administered 2013-02-07: 2 g via INTRAVENOUS

## 2013-02-07 MED ORDER — SCOPOLAMINE 1 MG/3DAYS TD PT72
MEDICATED_PATCH | TRANSDERMAL | Status: AC
  Administered 2013-02-07: 1.5 mg via TRANSDERMAL
  Filled 2013-02-07: qty 1

## 2013-02-07 MED ORDER — SIMETHICONE 80 MG PO CHEW: 80.0000 mg | CHEWABLE_TABLET | ORAL | Status: DC | PRN

## 2013-02-07 MED ORDER — ONDANSETRON HCL 4 MG PO TABS: 4.0000 mg | ORAL_TABLET | ORAL | Status: DC | PRN

## 2013-02-07 MED ORDER — EPHEDRINE SULFATE 50 MG/ML IJ SOLN
INTRAMUSCULAR | Status: DC | PRN
  Administered 2013-02-07: 10 mg via INTRAVENOUS
  Administered 2013-02-07: 5 mg via INTRAVENOUS

## 2013-02-07 MED ORDER — DIPHENHYDRAMINE HCL 25 MG PO CAPS: 25.0000 mg | ORAL_CAPSULE | Freq: Four times a day (QID) | ORAL | Status: DC | PRN

## 2013-02-07 MED ORDER — LANOLIN HYDROUS EX OINT: 1.0000 "application " | TOPICAL_OINTMENT | CUTANEOUS | Status: DC | PRN

## 2013-02-07 MED ORDER — KETOROLAC TROMETHAMINE 60 MG/2ML IM SOLN
INTRAMUSCULAR | Status: AC
  Administered 2013-02-07: 60 mg via INTRAMUSCULAR
  Filled 2013-02-07: qty 2

## 2013-02-07 MED ORDER — OXYTOCIN 10 UNIT/ML IJ SOLN
INTRAMUSCULAR | Status: AC
  Filled 2013-02-07: qty 4

## 2013-02-07 MED ORDER — PHENYLEPHRINE HCL 10 MG/ML IJ SOLN
INTRAMUSCULAR | Status: DC | PRN
  Administered 2013-02-07: 40 ug via INTRAVENOUS
  Administered 2013-02-07 (×3): 80 ug via INTRAVENOUS

## 2013-02-07 MED ORDER — SIMETHICONE 80 MG PO CHEW
80.0000 mg | CHEWABLE_TABLET | Freq: Three times a day (TID) | ORAL | Status: DC
  Administered 2013-02-07 – 2013-02-10 (×10): 80 mg via ORAL

## 2013-02-07 MED ORDER — DIPHENHYDRAMINE HCL 25 MG PO CAPS: 25.0000 mg | ORAL_CAPSULE | ORAL | Status: DC | PRN

## 2013-02-07 MED ORDER — MORPHINE SULFATE 0.5 MG/ML IJ SOLN
INTRAMUSCULAR | Status: AC
  Filled 2013-02-07: qty 10

## 2013-02-07 MED ORDER — BUPIVACAINE IN DEXTROSE 0.75-8.25 % IT SOLN
INTRATHECAL | Status: DC | PRN
  Administered 2013-02-07: 1.4 mL via INTRATHECAL

## 2013-02-07 MED ORDER — SCOPOLAMINE 1 MG/3DAYS TD PT72: 1.0000 | MEDICATED_PATCH | Freq: Once | TRANSDERMAL | Status: AC

## 2013-02-07 MED ORDER — BUPIVACAINE HCL 0.5 % IJ SOLN
INTRAMUSCULAR | Status: DC | PRN
  Administered 2013-02-07: 30 mL

## 2013-02-07 MED ORDER — NALOXONE HCL 1 MG/ML IJ SOLN
1.0000 ug/kg/h | INTRAVENOUS | Status: DC | PRN
  Filled 2013-02-07: qty 2

## 2013-02-07 MED ORDER — SENNOSIDES-DOCUSATE SODIUM 8.6-50 MG PO TABS
2.0000 | ORAL_TABLET | Freq: Every day | ORAL | Status: DC
  Administered 2013-02-07 – 2013-02-09 (×3): 2 via ORAL

## 2013-02-07 MED ORDER — MEPERIDINE HCL 25 MG/ML IJ SOLN: 6.2500 mg | INTRAMUSCULAR | Status: DC | PRN

## 2013-02-07 MED ORDER — ONDANSETRON HCL 4 MG/2ML IJ SOLN
INTRAMUSCULAR | Status: AC
  Filled 2013-02-07: qty 2

## 2013-02-07 MED ORDER — LACTATED RINGERS IV SOLN: INTRAVENOUS | Status: DC

## 2013-02-07 MED ORDER — PROMETHAZINE HCL 25 MG/ML IJ SOLN: 6.2500 mg | INTRAMUSCULAR | Status: DC | PRN

## 2013-02-07 MED ORDER — PNEUMOCOCCAL VAC POLYVALENT 25 MCG/0.5ML IJ INJ
0.5000 mL | INJECTION | INTRAMUSCULAR | Status: AC
  Administered 2013-02-08: 0.5 mL via INTRAMUSCULAR
  Filled 2013-02-07: qty 0.5

## 2013-02-07 MED ORDER — CITRIC ACID-SODIUM CITRATE 334-500 MG/5ML PO SOLN: 30.0000 mL | Freq: Once | ORAL | Status: DC

## 2013-02-07 MED ORDER — FENTANYL CITRATE 0.05 MG/ML IJ SOLN
INTRAMUSCULAR | Status: DC | PRN
  Administered 2013-02-07: 12.5 ug via INTRATHECAL
  Administered 2013-02-07: 37.5 ug via INTRAVENOUS

## 2013-02-07 MED ORDER — ONDANSETRON HCL 4 MG/2ML IJ SOLN: 4.0000 mg | INTRAMUSCULAR | Status: DC | PRN

## 2013-02-07 MED ORDER — FENTANYL CITRATE 0.05 MG/ML IJ SOLN: 25.0000 ug | INTRAMUSCULAR | Status: DC | PRN

## 2013-02-07 MED ORDER — METOCLOPRAMIDE HCL 5 MG/ML IJ SOLN: 10.0000 mg | Freq: Three times a day (TID) | INTRAMUSCULAR | Status: DC | PRN

## 2013-02-07 MED ORDER — OXYCODONE-ACETAMINOPHEN 5-325 MG PO TABS
1.0000 | ORAL_TABLET | ORAL | Status: DC | PRN
  Administered 2013-02-07: 1 via ORAL
  Administered 2013-02-07 – 2013-02-09 (×8): 2 via ORAL
  Administered 2013-02-10 (×2): 1 via ORAL
  Filled 2013-02-07 (×5): qty 2
  Filled 2013-02-07 (×2): qty 1
  Filled 2013-02-07: qty 2
  Filled 2013-02-07: qty 1
  Filled 2013-02-07 (×2): qty 2

## 2013-02-07 MED ORDER — ZOLPIDEM TARTRATE 5 MG PO TABS: 5.0000 mg | ORAL_TABLET | Freq: Every evening | ORAL | Status: DC | PRN

## 2013-02-07 MED ORDER — ONDANSETRON HCL 4 MG/2ML IJ SOLN
INTRAMUSCULAR | Status: DC | PRN
  Administered 2013-02-07: 4 mg via INTRAVENOUS

## 2013-02-07 MED ORDER — ONDANSETRON HCL 4 MG/2ML IJ SOLN: 4.0000 mg | Freq: Three times a day (TID) | INTRAMUSCULAR | Status: DC | PRN

## 2013-02-07 MED ORDER — EPHEDRINE 5 MG/ML INJ
INTRAVENOUS | Status: AC
  Filled 2013-02-07: qty 10

## 2013-02-07 MED ORDER — NALOXONE HCL 0.4 MG/ML IJ SOLN: 0.4000 mg | INTRAMUSCULAR | Status: DC | PRN

## 2013-02-07 MED ORDER — IBUPROFEN 600 MG PO TABS
600.0000 mg | ORAL_TABLET | Freq: Four times a day (QID) | ORAL | Status: DC
  Administered 2013-02-07 – 2013-02-10 (×11): 600 mg via ORAL
  Filled 2013-02-07 (×11): qty 1

## 2013-02-07 MED ORDER — MORPHINE SULFATE (PF) 0.5 MG/ML IJ SOLN
INTRAMUSCULAR | Status: DC | PRN
  Administered 2013-02-07: .2 mg via INTRATHECAL

## 2013-02-07 MED ORDER — NALBUPHINE HCL 10 MG/ML IJ SOLN
5.0000 mg | INTRAMUSCULAR | Status: DC | PRN
  Filled 2013-02-07: qty 1

## 2013-02-07 MED ORDER — DIPHENHYDRAMINE HCL 50 MG/ML IJ SOLN: 25.0000 mg | INTRAMUSCULAR | Status: DC | PRN

## 2013-02-07 MED ORDER — PRENATAL MULTIVITAMIN CH
1.0000 | ORAL_TABLET | Freq: Every day | ORAL | Status: DC
  Administered 2013-02-08 – 2013-02-10 (×3): 1 via ORAL
  Filled 2013-02-07 (×3): qty 1

## 2013-02-07 MED ORDER — FENTANYL CITRATE 0.05 MG/ML IJ SOLN
INTRAMUSCULAR | Status: AC
  Filled 2013-02-07: qty 2

## 2013-02-07 MED ORDER — MENTHOL 3 MG MT LOZG
1.0000 | LOZENGE | OROMUCOSAL | Status: DC | PRN
  Filled 2013-02-07: qty 9

## 2013-02-07 SURGICAL SUPPLY — 30 items
BARRIER ADHS 3X4 INTERCEED (GAUZE/BANDAGES/DRESSINGS) ×6 IMPLANT
CLIP FILSHIE TUBAL LIGA STRL (Clip) ×3 IMPLANT
CLOTH BEACON ORANGE TIMEOUT ST (SAFETY) ×3 IMPLANT
CONTAINER PREFILL 10% NBF 15ML (MISCELLANEOUS) IMPLANT
DRAIN JACKSON PRT FLT 7MM (DRAIN) IMPLANT
DRAPE LG THREE QUARTER DISP (DRAPES) ×3 IMPLANT
DRSG OPSITE POSTOP 4X10 (GAUZE/BANDAGES/DRESSINGS) ×3 IMPLANT
DRSG OPSITE POSTOP 4X12 (GAUZE/BANDAGES/DRESSINGS) ×3 IMPLANT
DURAPREP 26ML APPLICATOR (WOUND CARE) ×3 IMPLANT
ELECT REM PT RETURN 9FT ADLT (ELECTROSURGICAL) ×3
ELECTRODE REM PT RTRN 9FT ADLT (ELECTROSURGICAL) ×2 IMPLANT
EVACUATOR SILICONE 100CC (DRAIN) IMPLANT
EXTRACTOR VACUUM M CUP 4 TUBE (SUCTIONS) IMPLANT
GLOVE BIO SURGEON STRL SZ7 (GLOVE) ×3 IMPLANT
GLOVE BIOGEL PI IND STRL 7.0 (GLOVE) ×2 IMPLANT
GLOVE BIOGEL PI INDICATOR 7.0 (GLOVE) ×1
GOWN STRL REIN XL XLG (GOWN DISPOSABLE) ×6 IMPLANT
KIT ABG SYR 3ML LUER SLIP (SYRINGE) IMPLANT
NEEDLE HYPO 25X5/8 SAFETYGLIDE (NEEDLE) ×3 IMPLANT
NS IRRIG 1000ML POUR BTL (IV SOLUTION) ×3 IMPLANT
PACK C SECTION WH (CUSTOM PROCEDURE TRAY) ×3 IMPLANT
PAD OB MATERNITY 4.3X12.25 (PERSONAL CARE ITEMS) ×3 IMPLANT
RTRCTR C-SECT PINK 25CM LRG (MISCELLANEOUS) ×6 IMPLANT
STAPLER VISISTAT 35W (STAPLE) IMPLANT
SUT VIC AB 0 CTX 36 (SUTURE) ×5
SUT VIC AB 0 CTX36XBRD ANBCTRL (SUTURE) ×10 IMPLANT
SUT VIC AB 4-0 KS 27 (SUTURE) IMPLANT
TOWEL OR 17X24 6PK STRL BLUE (TOWEL DISPOSABLE) ×9 IMPLANT
TRAY FOLEY CATH 14FR (SET/KITS/TRAYS/PACK) ×3 IMPLANT
WATER STERILE IRR 1000ML POUR (IV SOLUTION) ×3 IMPLANT

## 2013-02-07 NOTE — Progress Notes (Signed)
Patient ID: Stephanie Warren, female   DOB: [birth year], 42 y.o.   MRN: [id number]   FACULTY PRACTICE ANTEPARTUM COMPREHENSIVE PROGRESS NOTE  Stephanie Warren is a 42 y.o. G5P1031 at [redacted]w[redacted]d  who is admitted for PPROM.  Estimated Date of Delivery: 05/01/13 Fetal presentation is oblique, head to RLQ, presenting cord.  Length of Stay:  3 Days. 02/04/2013  Subjective: Pt reports only mild contractions last 24 hours - 10 in 24 hours, some bleeding and fluid loss occasionally, decreased fetal movement. No abdominal pain, fever or chills.  Vitals:  Blood pressure 103/56, pulse 63, temperature 98.2 F (36.8 C), temperature source Oral, resp. rate 20, height 5\' 5"  (1.651 m), weight 82.555 kg (182 lb), last menstrual period 07/25/2012.  Physical Examination: GEN:  Alert, no distress CV:  RRR, no murmur RESP:  CTAB ABD:  Non-tender Cervical Exam: Not evaluated. Extremities: extremities normal, atraumatic, no cyanosis or edema  Membranes:intact, ruptured  Fetal Monitoring:  Baseline: 140 bpm, Variability: Fair (1-6 bpm), Accelerations: Non-reactive but appropriate for gestational age and Decelerations: Variable: severe - repetitive variables to 70-90s for 1-1.5 minutes every 5 to 7 minutes. Good recovery and good strip in between.  Labs:  No results found for this or any previous visit (from the past 24 hour(s)).  Imaging Studies:    No new  Medications:  Scheduled . amoxicillin  500 mg Oral Q8H  . butorphanol  1 mg Intravenous Once  . docusate sodium  100 mg Oral Daily  . erythromycin  250 mg Oral Q6H  . prenatal multivitamin  1 tablet Oral Q1200  . valACYclovir  500 mg Oral BID   Magnesium 2 g/hour  I have reviewed the patient's current medications.  ASSESSMENT: PPROM Patient Active Problem List   Diagnosis Date Noted  . Elderly multigravida with antepartum condition or complication 11/15/2012  . Supervision of other normal pregnancy 11/15/2012  . Previous cesarean delivery, antepartum  condition or complication 11/15/2012  . Uterine fibroid in antepartum period 11/15/2012  . HSV (herpes simplex virus) anogenital infection 11/15/2012  . Tobacco use complicating pregnancy 11/15/2012  . Active smoker 11/15/2012  . Alcohol use complicating pregnancy  11/15/2012    PLAN: No signs infection Continue to monitor FHTs and contractions Delivery for chorio, fetal distress, signs of labor Repeat C-section desired Continue routine antenatal care.  Stephanie Form, MD

## 2013-02-07 NOTE — Anesthesia Preprocedure Evaluation (Signed)
Anesthesia Evaluation  Patient identified by MRN, date of birth, ID band Patient awake    Reviewed: Allergy & Precautions, H&P , NPO status , Patient's Chart, lab work & pertinent test results  Airway Mallampati: II TM Distance: >3 FB Neck ROM: full    Dental no notable dental hx.    Pulmonary    Pulmonary exam normal       Cardiovascular negative cardio ROS      Neuro/Psych negative neurological ROS  negative psych ROS   GI/Hepatic negative GI ROS, Neg liver ROS,   Endo/Other  negative endocrine ROS  Renal/GU negative Renal ROS     Musculoskeletal negative musculoskeletal ROS (+)   Abdominal Normal abdominal exam  (+)   Peds negative pediatric ROS (+)  Hematology negative hematology ROS (+)   Anesthesia Other Findings   Reproductive/Obstetrics (+) Pregnancy                           Anesthesia Physical Anesthesia Plan  ASA: II and emergent  Anesthesia Plan: Spinal   Post-op Pain Management:    Induction:   Airway Management Planned:   Additional Equipment:   Intra-op Plan:   Post-operative Plan:   Informed Consent: I have reviewed the patients History and Physical, chart, labs and discussed the procedure including the risks, benefits and alternatives for the proposed anesthesia with the patient or authorized representative who has indicated his/her understanding and acceptance.     Plan Discussed with: CRNA and Surgeon  Anesthesia Plan Comments:         Anesthesia Quick Evaluation

## 2013-02-07 NOTE — Transfer of Care (Signed)
Immediate Anesthesia Transfer of Care Note  Patient: Stephanie Warren  Procedure(s) Performed: Procedure(s): CESAREAN SECTION WITH BILATERAL TUBAL LIGATION (Bilateral)  Patient Location: PACU  Anesthesia Type:Spinal  Level of Consciousness: awake, alert  and oriented  Airway & Oxygen Therapy: Patient Spontanous Breathing  Post-op Assessment: Report given to PACU RN and Post -op Vital signs reviewed and stable  Post vital signs: Reviewed and stable  Complications: No apparent anesthesia complications

## 2013-02-07 NOTE — Progress Notes (Signed)
Sodium citrate 30ml po. 

## 2013-02-07 NOTE — Addendum Note (Signed)
Addendum created 02/07/13 1146 by Turner Daniels, CRNA   Modules edited: Anesthesia Responsible Staff

## 2013-02-07 NOTE — Progress Notes (Signed)
I was notified by MFM that they recommend an urgent RLTCS for the diagnosis of non-reassuring fetal heart tones. I have discussed this with Stephanie Warren. She would like a PPS at the time of her RLTCS. She understands the risks of surgery as well as the failure rate  (and permanence of) the sterilization procedure. I have notified the OR, NICU, anesthesia.

## 2013-02-07 NOTE — Anesthesia Procedure Notes (Signed)
Spinal  Patient location during procedure: OR Start time: 02/07/2013 8:48 AM End time: 02/07/2013 8:53 AM Staffing Anesthesiologist: Sandrea Hughs Performed by: anesthesiologist  Preanesthetic Checklist Completed: patient identified, site marked, surgical consent, pre-op evaluation, timeout performed, IV checked, risks and benefits discussed and monitors and equipment checked Spinal Block Patient position: sitting Prep: DuraPrep Patient monitoring: heart rate, cardiac monitor, continuous pulse ox and blood pressure Approach: midline Location: L3-4 Injection technique: single-shot Needle Needle type: Sprotte  Needle gauge: 24 G Needle length: 9 cm Needle insertion depth: 6 cm Assessment Sensory level: T4

## 2013-02-07 NOTE — Progress Notes (Signed)
PC received from MD that they were calling for C/S based on MFM interpretation on FHR strip

## 2013-02-07 NOTE — Anesthesia Postprocedure Evaluation (Signed)
  Anesthesia Post-op Note  Patient: Stephanie Warren  Procedure(s) Performed: Procedure(s): CESAREAN SECTION WITH BILATERAL TUBAL LIGATION (Bilateral)  Patient Location: PACU and Women's Unit  Anesthesia Type:Spinal  Level of Consciousness: awake, alert  and oriented  Airway and Oxygen Therapy: Patient Spontanous Breathing  Post-op Pain: mild  Post-op Assessment: Patient's Cardiovascular Status Stable, Respiratory Function Stable, No signs of Nausea or vomiting and Pain level controlled  Post-op Vital Signs: stable  Complications: No apparent anesthesia complications

## 2013-02-07 NOTE — Anesthesia Postprocedure Evaluation (Signed)
Anesthesia Post Note  Patient: Stephanie Warren  Procedure(s) Performed: Procedure(s) (LRB): CESAREAN SECTION WITH BILATERAL TUBAL LIGATION (Bilateral)  Anesthesia type: Spinal  Patient location: PACU  Post pain: Pain level controlled  Post assessment: Post-op Vital signs reviewed  Last Vitals:  Filed Vitals:   02/07/13 0953  BP: 100/60  Pulse: 101  Temp: 36.4 C  Resp: 20    Post vital signs: Reviewed  Level of consciousness: awake  Complications: No apparent anesthesia complications

## 2013-02-07 NOTE — Op Note (Signed)
02/04/2013 - 02/07/2013  10:05 AM  PATIENT:  Stephanie Warren  42 y.o. female  PRE-OPERATIVE DIAGNOSIS:  Non-Reassuring Fetal Heart Rate; Desires Sterilization  POST-OPERATIVE DIAGNOSIS:  Non-Reassuring Fetal Heart Rate; Desires Sterilization  PROCEDURE:  Procedure(s): CESAREAN SECTION WITH BILATERAL TUBAL LIGATION (Bilateral)  SURGEON:  Surgeon(s) and Role:    * Allie Bossier, MD - Primary  PHYSICIAN ASSISTANT:   ASSISTANTS: Napoleon Form, MD   ANESTHESIA:   spinal  EBL:  Total I/O In: 2600 [I.V.:2600] Out: 800 [Urine:300; Blood:500]  BLOOD ADMINISTERED:none  DRAINS: none   LOCAL MEDICATIONS USED:  MARCAINE     SPECIMEN:  Source of Specimen:  cord blood, placenta, cord gas  DISPOSITION OF SPECIMEN:  PATHOLOGY  COUNTS:  YES  TOURNIQUET:  * No tourniquets in log *  DICTATION: .Dragon Dictation  PLAN OF CARE: Admit to inpatient   PATIENT DISPOSITION:  PACU - hemodynamically stable.   Delay start of Pharmacological VTE agent (>24hrs) due to surgical blood loss or risk of bleeding: not applicable  FINDINGS: living female infant with Apgars of 0,2,and 6 at 10 minutes                    Intact anterior placenta                    Normal adnexa                    Slight heart-shape of uterus   The risks, benefits, and alternatives of surgery were explained, understood, and accepted. She told me that she would like to have permanent sterility in the form of a tubal ligation. She understands the permanence of this procedure as well as the 1/300 failure rate. She was taken to the operating room, and spinal anesthesia was applied without complication. She was placed in a dorsosupine position with a left lateral tilt. A Foley catheter was placed and it drained clear urine throughout the case. Her abdomen and vagina were prepped and draped in the usual sterile fashion. After adequate anesthesia was assured, a transverse incision was made at the site of her C-section scar. The incision  was carried down through the subcutaneous tissue to the fascia. Fascia was scored in the midline. Fascial incision was extended bilaterally. The rectus muscles were separated from the fascia. The rectus muscles were then pulled to each side, allowing access to the uterus. There was virtually no developed lower uterine segment and the decision was made made to make a vertical incision. An Alexis retractor was placed. A vertical incision was made. An anterior placenta was noted. Amniotomy was performed with Allis clamps. No fluid was noted. The baby was noted to be in an oblique position with the back down. The baby was converted to a vertex position and delivered. Apgars are noted above. A cord segment was obtained to get a cord gas sample in the future. The uterus was exteriorized and cleaned with a dry sponge. There was a gentle heart-shaped of the fundus. The uterine incision was closed in 2 layers, the second layer and dictating the first. Excellent hemostasis was noted. I again discussed with the patient a tubal ligation. I specifically asked her that if this baby should I would she still wanted have her tubes tied. She emphatically stated that she does not ever want to be pregnant again. I therefore placed a Filshie clip across each oviduct in the isthmic region of each tube. I placed Interceed across the  hemostatic uterine incision. I reapproximated the peritoneum with 2-0 Vicryl suture. I closed the fascia with a PDS 0 loop in a running nonlocking fashion. The subcutaneous tissue was irrigated, clean, and dry. It was then infiltrated with 30 mL for 0.5% Marcaine a subcuticular closure was done with 3-0 Vicryl suture in a running nonlocking fashion. Steri-Strips were placed across her incision excellent cosmetic results were obtained. She was taken to recovery room in stable condition.

## 2013-02-08 ENCOUNTER — Encounter (HOSPITAL_COMMUNITY): Payer: Self-pay | Admitting: Obstetrics & Gynecology

## 2013-02-08 LAB — CBC
Platelets: 290 10*3/uL (ref 150–400)
RDW: 13.3 % (ref 11.5–15.5)
WBC: 9.9 10*3/uL (ref 4.0–10.5)

## 2013-02-08 NOTE — Progress Notes (Signed)
Subjective: Postpartum Day #1: Cesarean Delivery and PPS Patient reports tolerating PO and no problems voiding.  She is ambulating well.  Objective: Vital signs in last 24 hours: Temp:  [97.4 F (36.3 C)-98.7 F (37.1 C)] 98.1 F (36.7 C) (05/14 0530) Pulse Rate:  [59-101] 71 (05/14 0530) Resp:  [15-24] 18 (05/14 0530) BP: (90-117)/(49-79) 90/67 mmHg (05/14 0530) SpO2:  [95 %-100 %] 95 % (05/14 0530) Weight:  [82.555 kg (182 lb)] 82.555 kg (182 lb) (05/13 1130)  Physical Exam:  General: alert Lochia: appropriate Uterine Fundus: firm @ U-1 Incision: healing well DVT Evaluation: No evidence of DVT seen on physical exam.   Recent Labs  02/05/13 1311 02/08/13 0535  HGB 11.5* 9.8*  HCT 34.1* 29.3*    Assessment/Plan: Status post Cesarean section. Doing well postoperatively.  Continue current care.  Ludger Bones C. 02/08/2013, 7:26 AM

## 2013-02-09 NOTE — Progress Notes (Signed)
CSW attempted to meet with MOB in her third floor room to complete assessment, but RN stated that she was sleeping at this time.  CSW will attempt again at a later time. 

## 2013-02-09 NOTE — Progress Notes (Signed)
Ur chart review completed.  

## 2013-02-09 NOTE — Progress Notes (Signed)
CSW attempted again to meet with MOB to complete assessment, but NNP was talking with her at this time.  CSW waited for 20 minutes, but will have to return again at a later time.  CSW has requested of 3rd floor RN to not allow MOB's discharge tomorrow until CSW has a chance to talk with her.

## 2013-02-09 NOTE — Progress Notes (Signed)
Post Partum Day 2 Subjective: no complaints, up ad lib, voiding, tolerating PO, + flatus and had small BM yesrerday but, feels lots of gas today.  Pain well controlled.  Objective: Blood pressure 112/81, pulse 61, temperature 98.1 F (36.7 C), temperature source Oral, resp. rate 18, height 5\' 5"  (1.651 m), weight 182 lb (82.555 kg), last menstrual period 07/25/2012, SpO2 99.00%, unknown if currently breastfeeding.  Physical Exam:  General: alert, cooperative and no distress Lochia: appropriate Uterine Fundus: firm Incision: healing well, no significant drainage, dressing removed DVT Evaluation: No evidence of DVT seen on physical exam.   Recent Labs  02/08/13 0535  HGB 9.8*  HCT 29.3*    Assessment/Plan: Plan for discharge tomorrow and Contraception s/p BTL Reviewed the importance of using the breast pump   LOS: 5 days   HARRAWAY-SMITH, Alycia Cooperwood 02/09/2013, 7:50 AM

## 2013-02-10 DIAGNOSIS — Z98891 History of uterine scar from previous surgery: Secondary | ICD-10-CM

## 2013-02-10 LAB — CULTURE, BETA STREP (GROUP B ONLY)

## 2013-02-10 MED ORDER — FERROUS SULFATE 325 (65 FE) MG PO TABS: 325.0000 mg | ORAL_TABLET | Freq: Two times a day (BID) | ORAL | Status: DC

## 2013-02-10 MED ORDER — OXYCODONE-ACETAMINOPHEN 5-325 MG PO TABS: 2.0000 | ORAL_TABLET | ORAL | Status: DC | PRN

## 2013-02-10 MED ORDER — SENNOSIDES-DOCUSATE SODIUM 8.6-50 MG PO TABS: 2.0000 | ORAL_TABLET | Freq: Every day | ORAL | Status: DC

## 2013-02-10 MED ORDER — IBUPROFEN 600 MG PO TABS: 600.0000 mg | ORAL_TABLET | Freq: Four times a day (QID) | ORAL | Status: DC
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
## 2013-02-10 NOTE — Progress Notes (Signed)
CSW completed assessment.  Full documentation to follow.

## 2013-02-10 NOTE — Progress Notes (Signed)
Pt. Is discharged in the care of friend. Downstairs per wheelchair. Stable Infant to remain in nicu. Discharge  Instructions with Rx instructions were given to pt. Questions were asked and answered.Abdominal incision is clean and dry.Stable.

## 2013-02-10 NOTE — Discharge Summary (Signed)
Obstetric Discharge Summary Stephanie Warren is a 42 y.o. [id number] admitted at [redacted]w[redacted]d for PPROM. She was treated with betamethasone, magnesium and latency antibiotics. She had worsening deep variables on monitoring and was taken for urgent cesarean section for NRFHTs. C-section was done with classical/vertical uterine incision and BTL was done at same time. Patient is pumping breastmilk, and baby is in NICU and is eating, on O2 but off ventilator and stable.  Reason for Admission: PPROM Prenatal Procedures: NST and latency antibiotics, magnesium and betamethasone Intrapartum Procedures: cesarean: classical and tubal ligation Postpartum Procedures: none Complications-Operative and Postpartum: none Hemoglobin  Date Value Range Status  02/08/2013 9.8* 12.0 - 15.0 g/dL Final  5/78/4696 29.5   Final     HCT  Date Value Range Status  02/08/2013 29.3* 36.0 - 46.0 % Final  10/19/2012 35   Final    Physical Exam:  General: alert, cooperative and no distress Lochia: appropriate Uterine Fundus: firm Incision: healing well, no significant drainage, no dehiscence, no significant erythema DVT Evaluation: No evidence of DVT seen on physical exam. Negative Homan's sign. No cords or calf tenderness. No significant calf/ankle edema.  Discharge Diagnoses: PPROM, preterm delivery, s/p classical cesarean section and BTL  Discharge Information: Date: 02/10/2013 Activity: pelvic rest Diet: routine Medications: PNV, Ibuprofen, Colace, Iron and Percocet Condition: stable Instructions: refer to practice specific booklet Discharge to: home Follow-up Information   Follow up with [facility]. Call in 6 weeks. (For postpartum visit. You should receive a call with appointment date and time. If you do not hear by early next week, call the number above.)    Contact information:   [address] [place] [address] [phone]      Newborn Data: Live born female  Birth Weight: 2 lb 4.3 oz  (1030 g) APGAR: 0, 2  In NICU  Napoleon Form 02/10/2013, 8:02 AM

## 2013-02-10 NOTE — Progress Notes (Signed)
This was my second visit with Stephanie Warren, whom I met on antenatal when she was hopeful that she would be able to keep the baby inside longer.  She is adjusting to the fact that Stephanie Warren is here and in the NICU.  She was bonding well with him, speaking to him and touching his feet gently.  She is grateful that Stephanie Warren is doing well and that Stephanie Warren is feisty.  She has some family support from her brother and is touched that her son looks like her own father who is no longer living.  I offered reflective listening and encouragement.  She is aware of on-going availability of chaplain support and we will continue to follow up with her when we see her in the NICU.  [employer] Pager, [phone] 12:05 PM   02/10/13 1200  Clinical Encounter Type  Visited With Patient and family together  Visit Type Spiritual support;Follow-up  Referral From Nurse  Spiritual Encounters  Spiritual Needs Emotional  Stress Factors  Patient Stress Factors Family relationships;Major life changes (Baby in NICU)

## 2013-02-13 NOTE — Discharge Summary (Signed)
Attestation of Attending Supervision of Advanced Practitioner (CNM/NP): Evaluation and management procedures were performed by the Advanced Practitioner under my supervision and collaboration.  I have reviewed the Advanced Practitioner's note and chart, and I agree with the management and plan.  Theressa Piedra 02/13/2013 9:44 AM   

## 2013-02-14 ENCOUNTER — Telehealth: Payer: Self-pay | Admitting: *Deleted

## 2013-02-14 NOTE — Telephone Encounter (Signed)
Pt left message stating that she delivered her baby @ 27-28 wks and needs follow up appt.  I sent message to scheduling staff.

## 2013-02-20 ENCOUNTER — Encounter: Payer: Self-pay | Admitting: Obstetrics & Gynecology

## 2013-02-21 ENCOUNTER — Telehealth: Payer: Self-pay | Admitting: *Deleted

## 2013-02-21 NOTE — Telephone Encounter (Signed)
Pt left message requesting Rx refill - did not state the name of medication.

## 2013-02-22 ENCOUNTER — Encounter: Payer: Self-pay | Admitting: *Deleted

## 2013-02-22 NOTE — Telephone Encounter (Signed)
Called patient back and she stated she needed a refill on her percocet, asked patient if I could put her on hold to speak with a provider and she stated her phone was about to go out but that she was coming by the hospital to see her son and said she would just come by the clinic then. Patient had no further questions

## 2013-03-06 ENCOUNTER — Telehealth: Payer: Self-pay | Admitting: *Deleted

## 2013-03-06 NOTE — Telephone Encounter (Signed)
Called patient and left her a message that I am returning her call.

## 2013-03-06 NOTE — Telephone Encounter (Signed)
Patient left a message that her incision is painful. She used a Ship broker to look at it and saw that there is blood and pus coming from it. She requested a phone call back.

## 2013-03-07 NOTE — Telephone Encounter (Signed)
Called pt regarding her report of pain and drainage from her incision.  She denies pain, redness or swelling of the incision at this time. She states that sometimes there is wetness in the area, however this may be from sweat. She also denies fever. I advised pt to keep her clinic appt as scheduled on 6/12. If something changes tomorrow, she should call the clinic.  Pt voiced understanding.

## 2013-03-09 ENCOUNTER — Ambulatory Visit: Payer: Medicaid Other | Admitting: Obstetrics and Gynecology

## 2013-03-09 ENCOUNTER — Encounter: Payer: Self-pay | Admitting: Obstetrics & Gynecology

## 2013-03-09 ENCOUNTER — Ambulatory Visit (INDEPENDENT_AMBULATORY_CARE_PROVIDER_SITE_OTHER): Payer: Medicaid Other | Admitting: Obstetrics & Gynecology

## 2013-03-09 MED ORDER — IBUPROFEN 600 MG PO TABS: 600.0000 mg | ORAL_TABLET | Freq: Four times a day (QID) | ORAL | Status: DC

## 2013-03-09 NOTE — Progress Notes (Signed)
  Subjective:     Stephanie Warren is a 42 y.o. female who presents for a postpartum visit. She is s/p RCS and BTS at 27 weeks for NRFHT on 02/07/13; infant in NICU.  I have fully reviewed the prenatal and intrapartum course.  Postpartum course has been complicated by cramping and bleeding, feels she is on her period.  Declines ultrasound for now, will reevaluate in one month. Bowel function is normal. Bladder function is normal. Patient is not sexually active. Contraception method is tubal ligation. Postpartum depression screening: negative.  The following portions of the patient's history were reviewed and updated as appropriate: allergies, current medications, past family history, past medical history, past social history, past surgical history and problem list.  Last pap smear was on 10/19/12 was negative.  Review of Systems Pertinent items are noted in HPI.   Objective:    BP 132/94  Pulse 94  Temp(Src) 98.6 F (37 C) (Oral)  Wt 174 lb 12.8 oz (79.289 kg)  BMI 29.09 kg/m2  Breastfeeding? No  General:  alert and no distress   Breasts:  inspection negative, no nipple discharge or bleeding, no masses or nodularity palpable  Lungs: clear to auscultation bilaterally  Heart:  regular rate and rhythm  Abdomen: soft, non-tender; bowel sounds normal; no masses,  no organomegaly   Vulva:  normal  Vagina: normal vagina; old blood noted  Cervix:  normal  Corpus: normal  Adnexa:  normal adnexa  Rectal Exam: Not performed.        Assessment:     Normal postpartum exam. Pap smear not done at today's visit.   Plan:   1. Contraception: tubal ligation 2. Ibuprofen prescribed prn cramping 3. Follow up or as needed; bleeding precautions reviewed.

## 2013-03-14 ENCOUNTER — Telehealth: Payer: Self-pay

## 2013-03-14 NOTE — Telephone Encounter (Signed)
Pt called and stated that she has is having a reaction from a tea that is used to increase milk supply.   Called pt and pt informed me that she has a rash that is spreading and that she is taking mother's milk tea.  I advised pt to stop taking tea and that she can take Benadryl for rash.  I gave pt the lactation consultant # so that she could get all of her questions answered.  Pt stated understanding and did not have any other questions.

## 2013-03-24 ENCOUNTER — Encounter: Payer: Self-pay | Admitting: *Deleted

## 2013-04-10 ENCOUNTER — Ambulatory Visit: Payer: Medicaid Other | Admitting: Obstetrics & Gynecology

## 2013-04-13 ENCOUNTER — Telehealth: Payer: Self-pay | Admitting: *Deleted

## 2013-04-13 NOTE — Telephone Encounter (Signed)
Pt left message stating that she thinks she missed an appt on Monday 7/14. She would like to be rescheduled. Also her phone number has changed. New number is [phone].  Message sent to Admin pool to reschedule pt's appt.

## 2013-05-24 ENCOUNTER — Ambulatory Visit: Payer: Medicaid Other | Admitting: Obstetrics & Gynecology

## 2013-06-15 ENCOUNTER — Emergency Department (HOSPITAL_COMMUNITY)
Admission: EM | Admit: 2013-06-15 | Discharge: 2013-06-15 | Disposition: A | Discharge status: Home / Self Care | Payer: Medicaid Other | Source: Home / Self Care | Attending: Family Medicine | Admitting: Family Medicine

## 2013-06-15 ENCOUNTER — Emergency Department (INDEPENDENT_AMBULATORY_CARE_PROVIDER_SITE_OTHER): Payer: Medicaid Other

## 2013-06-15 ENCOUNTER — Encounter (HOSPITAL_COMMUNITY): Payer: Self-pay | Admitting: Emergency Medicine

## 2013-06-15 DIAGNOSIS — L03211 Cellulitis of face: Secondary | ICD-10-CM

## 2013-06-15 DIAGNOSIS — M545 Low back pain, unspecified: Secondary | ICD-10-CM

## 2013-06-15 DIAGNOSIS — L0201 Cutaneous abscess of face: Secondary | ICD-10-CM

## 2013-06-15 LAB — POCT URINALYSIS DIP (DEVICE)
Glucose, UA: NEGATIVE mg/dL
Ketones, ur: NEGATIVE mg/dL
Leukocytes, UA: NEGATIVE
Protein, ur: NEGATIVE mg/dL

## 2013-06-15 LAB — POCT PREGNANCY, URINE: Preg Test, Ur: NEGATIVE

## 2013-06-15 MED ORDER — MELOXICAM 15 MG PO TABS: 15.0000 mg | ORAL_TABLET | Freq: Every day | ORAL | Status: DC | PRN

## 2013-06-15 MED ORDER — DOXYCYCLINE HYCLATE 100 MG PO CAPS: 100.0000 mg | ORAL_CAPSULE | Freq: Two times a day (BID) | ORAL | Status: DC

## 2013-06-15 NOTE — ED Notes (Signed)
C/o back pain and eye pimple.

## 2013-06-15 NOTE — ED Provider Notes (Signed)
Stephanie Warren is a 42 y.o. female who presents to Urgent Care today for right skin irritation below the right eye present for several days worsening. She is applied Neosporin and hydrocortisone which has not helped. CV worsening has become tender. She denies any fevers chills nausea vomiting or diarrhea.  Additionally she notes chronic low back pain present for the last 4 months. It seems to be worsened after the birth of her child. She does lots of bending stooping and squatting which exacerbates her pain. She's tried Tylenol and ibuprofen which is limited somewhat helpful. She denies any radiating pain weakness numbness fevers chills nausea vomiting or diarrhea.   Past Medical History  Diagnosis Date  . Genital HSV   . Abnormal Pap smear   . Infection     yeast  . Asthma    History  Substance Use Topics  . Smoking status: Current Every Day Smoker -- 0.25 packs/day    Types: Cigarettes  . Smokeless tobacco: Not on file  . Alcohol Use: Yes     Comment: Beer . Maybe 4 amonth   ROS as above Medications reviewed. No current facility-administered medications for this encounter.   Current Outpatient Prescriptions  Medication Sig Dispense Refill  . albuterol (PROVENTIL HFA;VENTOLIN HFA) 108 (90 BASE) MCG/ACT inhaler Inhale 2 puffs into the lungs every 4 (four) hours as needed for wheezing.  1 Inhaler  0  . doxycycline (VIBRAMYCIN) 100 MG capsule Take 1 capsule (100 mg total) by mouth 2 (two) times daily.  20 capsule  0  . ferrous sulfate (FERROUSUL) 325 (65 FE) MG tablet Take 1 tablet (325 mg total) by mouth 2 (two) times daily.  60 tablet  2  . meloxicam (MOBIC) 15 MG tablet Take 1 tablet (15 mg total) by mouth daily as needed for pain.  30 tablet  0  . Prenatal Vit-Fe Fumarate-FA (PRENATAL MULTIVITAMIN) TABS Take 1 tablet by mouth every morning.      . senna-docusate (SENOKOT-S) 8.6-50 MG per tablet Take 2 tablets by mouth at bedtime.  30 tablet  1    Exam:  BP 127/88  Pulse 87   Temp(Src) 97.5 F (36.4 C) (Oral)  Resp 18  SpO2 100%  LMP 06/04/2013  Breastfeeding? No Gen: Well NAD HEENT: EOMI,  MMM pain-free eye motion Lungs: CTABL Nl WOB Heart: RRR no MRG Abd: NABS, NT, ND Exts: Non edematous BL  LE, warm and well perfused. Skin: Quarter-sized erythematous patch on right face. Mildly tender to the touch. No fluctuance. Mildly indurated  Back: Nontender spinal midline normal back range of motion strength is intact in lower extremities. Patient can squat and stand on toes and heels and has a normal gait. Capillary refill and sensation are intact distally bilateral lower extremities  Results for orders placed during the hospital encounter of 06/15/13 (from the past 24 hour(s))  POCT URINALYSIS DIP (DEVICE)     Status: None   Collection Time    06/15/13  2:01 PM      Result Value Range   Glucose, UA NEGATIVE  NEGATIVE mg/dL   Bilirubin Urine NEGATIVE  NEGATIVE   Ketones, ur NEGATIVE  NEGATIVE mg/dL   Specific Gravity, Urine >=1.030  1.005 - 1.030   Hgb urine dipstick NEGATIVE  NEGATIVE   pH 5.5  5.0 - 8.0   Protein, ur NEGATIVE  NEGATIVE mg/dL   Urobilinogen, UA 0.2  0.0 - 1.0 mg/dL   Nitrite NEGATIVE  NEGATIVE   Leukocytes, UA NEGATIVE  NEGATIVE  POCT PREGNANCY, URINE     Status: None   Collection Time    06/15/13  2:01 PM      Result Value Range   Preg Test, Ur NEGATIVE  NEGATIVE   Dg Lumbar Spine Complete  06/15/2013   CLINICAL DATA:  Low back pain.  EXAM: LUMBAR SPINE - COMPLETE 4+ VIEW  COMPARISON:  None.  FINDINGS: No fracture or subluxation is identified. There is some loss of disc space height at L3-4. Paraspinous structures demonstrate tubal ligation clips.  IMPRESSION: No acute finding.  L3-4 degenerative disc disease.   Electronically Signed   By: Drusilla Kanner M.D.   On: 06/15/2013 13:35    Assessment and Plan: 42 y.o. female with  1) possible cellulitis right face. Plan to treat empirically with doxycycline. Followup as needed 2)  chronic low back pain. Childcare plus DDD L3-L4 is causing her back pain. Plan for meloxicam heating pad and followup at sports medicine Discussed warning signs or symptoms. Please see discharge instructions. Patient expresses understanding.      Rodolph Bong, MD 06/15/13 [phone]

## 2013-06-21 ENCOUNTER — Encounter: Payer: Self-pay | Admitting: Obstetrics & Gynecology

## 2013-06-21 ENCOUNTER — Ambulatory Visit (INDEPENDENT_AMBULATORY_CARE_PROVIDER_SITE_OTHER): Payer: Medicaid Other | Admitting: Obstetrics & Gynecology

## 2013-06-21 ENCOUNTER — Other Ambulatory Visit (HOSPITAL_COMMUNITY)
Admission: RE | Admit: 2013-06-21 | Discharge: 2013-06-21 | Disposition: A | Discharge status: Home / Self Care | Payer: Medicaid Other | Source: Ambulatory Visit | Attending: Obstetrics & Gynecology | Admitting: Obstetrics & Gynecology

## 2013-06-21 VITALS — BP 106/77 | HR 77 | Temp 97.9°F | Ht 65.0 in | Wt 175.0 lb

## 2013-06-21 DIAGNOSIS — Z01419 Encounter for gynecological examination (general) (routine) without abnormal findings: Secondary | ICD-10-CM

## 2013-06-21 DIAGNOSIS — Z1151 Encounter for screening for human papillomavirus (HPV): Secondary | ICD-10-CM | POA: Insufficient documentation

## 2013-06-21 DIAGNOSIS — Z124 Encounter for screening for malignant neoplasm of cervix: Secondary | ICD-10-CM

## 2013-06-21 NOTE — Patient Instructions (Addendum)
Return to clinic for any scheduled appointments or for any gynecologic concerns as needed.   

## 2013-06-21 NOTE — Progress Notes (Signed)
Patient unsure of visit today, states she was supposed to follow up after her post partum visit because they couldn't do an exam/swab because she was on her period. Patient does desire annual exam with pap, does not remember when this was last done.

## 2013-06-22 NOTE — Progress Notes (Signed)
GYNECOLOGY CLINIC ENCOUNTER NOTE  History:  42 y.o. [id number] here today for pelvic exam and pap smear, had full physical exam during her postpartum visit on 03/09/13.  She has no complaints.  The following portions of the patient's history were reviewed and updated as appropriate: allergies, current medications, past family history, past medical history, past social history, past surgical history and problem list.  Review of Systems:  A comprehensive review of systems was negative.  Objective:  Physical Exam BP 106/77  Pulse 77  Temp(Src) 97.9 F (36.6 C) (Oral)  Ht 5\' 5"  (1.651 m)  Wt 175 lb (79.379 kg)  BMI 29.12 kg/m2  LMP 06/04/2013  Breastfeeding? No Gen: NAD Pelvic: Normal appearing external genitalia; normal appearing vaginal mucosa and cervix. Pap smear obtained.  Normal discharge.  Small uterus, no other palpable masses, no uterine or adnexal tenderness  Assessment & Plan:  Pap done, will follow up results and manage accordingly. Mammogram scholarship information given to patient Routine preventative health maintenance measures emphasized

## 2013-06-26 ENCOUNTER — Other Ambulatory Visit: Payer: Self-pay | Admitting: Obstetrics & Gynecology

## 2013-06-26 DIAGNOSIS — Z1231 Encounter for screening mammogram for malignant neoplasm of breast: Secondary | ICD-10-CM

## 2013-07-07 ENCOUNTER — Ambulatory Visit (HOSPITAL_COMMUNITY): Payer: Medicaid Other

## 2013-07-18 ENCOUNTER — Encounter (HOSPITAL_COMMUNITY): Payer: Medicaid Other

## 2013-08-24 ENCOUNTER — Encounter (HOSPITAL_COMMUNITY): Payer: Self-pay | Admitting: Emergency Medicine

## 2013-08-24 ENCOUNTER — Emergency Department (HOSPITAL_COMMUNITY): Payer: Medicaid Other

## 2013-08-24 ENCOUNTER — Emergency Department (HOSPITAL_COMMUNITY)
Admission: EM | Admit: 2013-08-24 | Discharge: 2013-08-24 | Disposition: A | Discharge status: Home / Self Care | Payer: Medicaid Other | Attending: Emergency Medicine | Admitting: Emergency Medicine

## 2013-08-24 DIAGNOSIS — J329 Chronic sinusitis, unspecified: Secondary | ICD-10-CM

## 2013-08-24 DIAGNOSIS — R0989 Other specified symptoms and signs involving the circulatory and respiratory systems: Secondary | ICD-10-CM | POA: Insufficient documentation

## 2013-08-24 DIAGNOSIS — R0602 Shortness of breath: Secondary | ICD-10-CM | POA: Insufficient documentation

## 2013-08-24 DIAGNOSIS — R059 Cough, unspecified: Secondary | ICD-10-CM | POA: Insufficient documentation

## 2013-08-24 DIAGNOSIS — R05 Cough: Secondary | ICD-10-CM | POA: Insufficient documentation

## 2013-08-24 DIAGNOSIS — R609 Edema, unspecified: Secondary | ICD-10-CM | POA: Insufficient documentation

## 2013-08-24 DIAGNOSIS — J019 Acute sinusitis, unspecified: Secondary | ICD-10-CM | POA: Insufficient documentation

## 2013-08-24 DIAGNOSIS — J45909 Unspecified asthma, uncomplicated: Secondary | ICD-10-CM | POA: Insufficient documentation

## 2013-08-24 DIAGNOSIS — F17200 Nicotine dependence, unspecified, uncomplicated: Secondary | ICD-10-CM | POA: Insufficient documentation

## 2013-08-24 DIAGNOSIS — Z79899 Other long term (current) drug therapy: Secondary | ICD-10-CM | POA: Insufficient documentation

## 2013-08-24 MED ORDER — AMOXICILLIN-POT CLAVULANATE 875-125 MG PO TABS
1.0000 | ORAL_TABLET | Freq: Once | ORAL | Status: AC
  Administered 2013-08-24: 1 via ORAL
  Filled 2013-08-24: qty 1

## 2013-08-24 MED ORDER — AMOXICILLIN-POT CLAVULANATE 875-125 MG PO TABS: 1.0000 | ORAL_TABLET | Freq: Two times a day (BID) | ORAL | Status: DC

## 2013-08-24 NOTE — ED Provider Notes (Signed)
Medical screening examination/treatment/procedure(s) were performed by non-physician practitioner and as supervising physician I was immediately available for consultation/collaboration.  EKG Interpretation   None         Kanden Carey B. Vernel Donlan, MD 08/24/13 2019 

## 2013-08-24 NOTE — ED Provider Notes (Signed)
CSN: [id number]     Arrival date & time 08/24/13  1505 History  This chart was scribed for non-physician practitioner working with Stephanie Most B. Bernette Mayers, MD by Ashley Jacobs, ED scribe. This patient was seen in room TR07C/TR07C and the patient's care was started at 3:23 PM.  None    Chief Complaint  Patient presents with  . Nasal Congestion  . Cough   (Consider location/radiation/quality/duration/timing/severity/associated sxs/prior Treatment) The history is provided by the patient and medical records. No language interpreter was used.   HPI Comments: Stephanie Warren is a 42 y.o. female who presents to the Emergency Department complaining of nasal congestion and cough for the past four weeks. Pt has sinus pressure, swelling below right eye, and chest congestion for the past week. She has tried Vicks vapor rub, Mucinex and other OTC medication for symptoms without any relief. Pt denies fever, nausea and vomiting. She has a medical hx of asthma and previous episodes of sinusitis. She had sick contact with her infant son, who is displaying similar symptoms. Pt currently smokes 2 cigarettes a day. Nothing seems to resolve her symptoms.  Past Medical History  Diagnosis Date  . Genital HSV   . Abnormal Pap smear   . Infection     yeast  . Asthma    Past Surgical History  Procedure Laterality Date  . Cesarean section    . Dilation and curettage of uterus      x3  . Rib fracture surgery      left side / 10 ribs broken. Horse back riding accident.  . Cyst excision perineal    . Bartholin gland cyst excision    . Fracture surgery    . Lung surgery      left lung puncture  . Cesarean section with bilateral tubal ligation Bilateral 02/07/2013    Procedure: CESAREAN SECTION WITH BILATERAL TUBAL LIGATION;  Surgeon: Allie Bossier, MD;  Location: [facility];  Service: Obstetrics;  Laterality: Bilateral;   Family History  Problem Relation Age of Onset  . Hypertension Father   . Hyperlipidemia Father    . Cancer Father     liver, lung, bone  . Asthma Father   . Alcohol abuse Father   . Hypertension Maternal Grandfather   . Hyperlipidemia Maternal Grandfather   . Cancer Maternal Grandfather     lung  . Stroke Maternal Grandfather   . Hypertension Paternal Grandfather   . Hyperlipidemia Paternal Grandfather   . Cancer Paternal Grandfather     lung and liver  . Stroke Paternal Grandfather   . Drug abuse Mother    History  Substance Use Topics  . Smoking status: Current Every Day Smoker -- 0.25 packs/day    Types: Cigarettes  . Smokeless tobacco: Not on file  . Alcohol Use: Yes     Comment: Beer . Maybe 4 amonth   OB History   Grav Para Term Preterm Abortions TAB SAB Ect Mult Living   5 2 1 1 3 2 1   2      Review of Systems  Constitutional: Negative for fever.  HENT: Positive for congestion, facial swelling, rhinorrhea and sinus pressure.   Respiratory: Positive for cough and shortness of breath.   Gastrointestinal: Negative for nausea and vomiting.  All other systems reviewed and are negative.    Allergies  Codeine  Home Medications   Current Outpatient Rx  Name  Route  Sig  Dispense  Refill  . albuterol (PROVENTIL HFA;VENTOLIN HFA) 108 (90 BASE)  MCG/ACT inhaler   Inhalation   Inhale 2 puffs into the lungs every 4 (four) hours as needed for wheezing.   1 Inhaler   0   . guaiFENesin (MUCINEX) 600 MG 12 hr tablet   Oral   Take by mouth 2 (two) times daily.         Marland Kitchen amoxicillin-clavulanate (AUGMENTIN) 875-125 MG per tablet   Oral   Take 1 tablet by mouth every 12 (twelve) hours.   14 tablet   0    BP 150/90  Pulse 82  Temp(Src) 98.5 F (36.9 C) (Oral)  Resp 18  SpO2 100%  LMP 07/27/2013 Physical Exam  Nursing note and vitals reviewed. Constitutional: She appears well-developed and well-nourished. No distress.  HENT:  Head: Normocephalic and atraumatic.  Right Ear: Hearing, tympanic membrane, external ear and ear canal normal.  Left Ear:  Hearing, tympanic membrane, external ear and ear canal normal.  Nose: Mucosal edema present. Right sinus exhibits maxillary sinus tenderness.  Mouth/Throat: Uvula is midline, oropharynx is clear and moist and mucous membranes are normal.  Eyes: Conjunctivae are normal. No scleral icterus.  Neck: Normal range of motion.  Cardiovascular: Normal rate, regular rhythm and normal heart sounds.   Pulmonary/Chest: Effort normal. No respiratory distress. She has wheezes. She has rales. She exhibits no tenderness.  Mild diffuse wheezes and rhonchi No respiratory distress  Able to speak in full sentences  Abdominal: Soft. Bowel sounds are normal. She exhibits no distension and no mass. There is no tenderness. There is no rebound and no guarding.  Musculoskeletal: Normal range of motion.  Neurological: She is alert.  Skin: Skin is warm and dry. She is not diaphoretic.    ED Course  Procedures (including critical care time) DIAGNOSTIC STUDIES: Oxygen Saturation is 100% on room air, normal by my interpretation.    COORDINATION OF CARE: 3:27 PM Discussed course of care with pt which includes chest x-ray. Pt understands and agrees.  Labs Review Labs Reviewed - No data to display Imaging Review Dg Chest 2 View  08/24/2013   CLINICAL DATA:  History of asthma now with productive cough and dyspnea for the preceding 4 weeks  EXAM: CHEST  2 VIEW  COMPARISON:  None.  FINDINGS: The lungs are mildly hyperinflated. There is blunting of the left lateral costophrenic angle with the suggestion of a meniscus at the left lung base on the frontal film. On the lateral film however this appears normal. There is no evidence of a pneumothorax nor pneumomediastinum. There is no no alveolar infiltrate. There are coarse lung markings in the retrocardiac region on the left suggesting subsegmental atelectasis. The cardiac silhouette is normal in size. The mediastinum is normal in width. The pulmonary vascularity is not  engorged. The bony thorax exhibits no acute abnormality.  IMPRESSION: 1. There is no evidence of pneumonia. There may be minimal subsegmental atelectasis in the left lower lobe. There is blunting of the left lateral costophrenic angle without significant abnormality demonstrated on the lateral film. No pleural effusion is evident. 2. There is no evidence of CHF nor of a pneumothorax.   Electronically Signed   By: David  [place]   On: 08/24/2013 16:12    EKG Interpretation   None       MDM   1. Sinusitis    Pt with URI type symptoms c/o persistent sinus congestion and pressure with cough x4 weeks w/o improvement.  On exam pt has moderate tenderness over right maxillary sinus as well as significant  nasal mucosal edema, greater on the right.  CXR: no evidence of pneumonia.  Will tx with Augmentin. Advised use of nasal saline rinses to help clear sinuses. Discussed establishing care with a PCP through [facility] and [facility] for ongoing healthcare needs. Pt verbalized understanding and agreement with tx plan.  I personally performed the services described in this documentation, which was scribed in my presence. The recorded information has been reviewed and is accurate.     Junius Finner, PA-C 08/24/13 1640

## 2013-08-24 NOTE — ED Notes (Signed)
Patient presents today with a chief complaint of sinusitis x one month with chest congestion x 1 week. Patient denies fevers. Patient reports taking Mucinex without relief.

## 2013-10-02 ENCOUNTER — Ambulatory Visit (HOSPITAL_COMMUNITY): Payer: Medicaid Other

## 2013-10-06 ENCOUNTER — Ambulatory Visit (HOSPITAL_COMMUNITY): Payer: Medicaid Other

## 2013-10-09 ENCOUNTER — Ambulatory Visit (HOSPITAL_COMMUNITY)
Admission: RE | Admit: 2013-10-09 | Discharge: 2013-10-09 | Disposition: A | Discharge status: Home / Self Care | Payer: Medicaid Other | Source: Ambulatory Visit | Attending: Obstetrics & Gynecology | Admitting: Obstetrics & Gynecology

## 2013-10-09 ENCOUNTER — Other Ambulatory Visit: Payer: Self-pay | Admitting: Obstetrics & Gynecology

## 2013-10-09 DIAGNOSIS — Z1231 Encounter for screening mammogram for malignant neoplasm of breast: Secondary | ICD-10-CM

## 2013-10-09 DIAGNOSIS — Z0000 Encounter for general adult medical examination without abnormal findings: Secondary | ICD-10-CM

## 2013-11-24 ENCOUNTER — Ambulatory Visit (HOSPITAL_COMMUNITY): Payer: Medicaid Other

## 2013-12-04 ENCOUNTER — Ambulatory Visit (HOSPITAL_COMMUNITY): Payer: Medicaid Other

## 2013-12-11 ENCOUNTER — Ambulatory Visit (HOSPITAL_COMMUNITY): Admission: RE | Admit: 2013-12-11 | Payer: Medicaid Other | Source: Ambulatory Visit

## 2014-02-01 IMAGING — CR DG LUMBAR SPINE COMPLETE 4+V
5 series · 5 of 5 positions shown · non-contrast
Comparison: None.

CLINICAL DATA: Low back pain.

EXAM:
LUMBAR SPINE - COMPLETE 4+ VIEW

[view not recorded (1 of 5)]
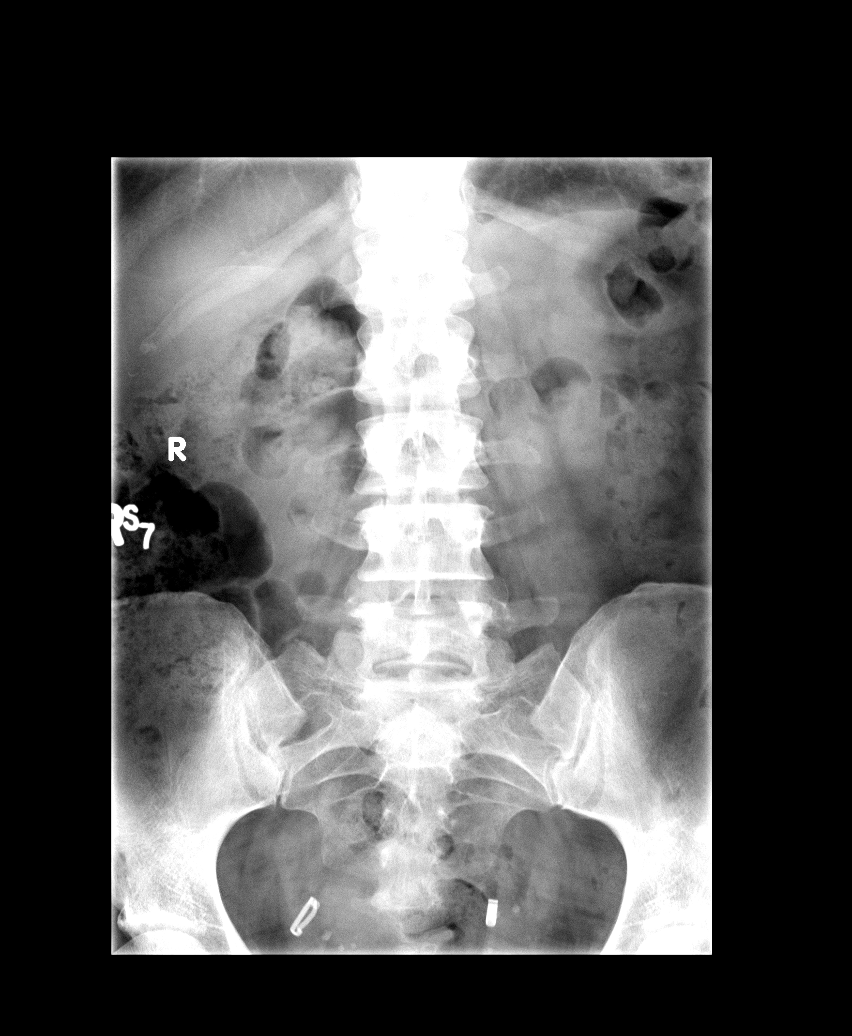

[view not recorded (2 of 5)]
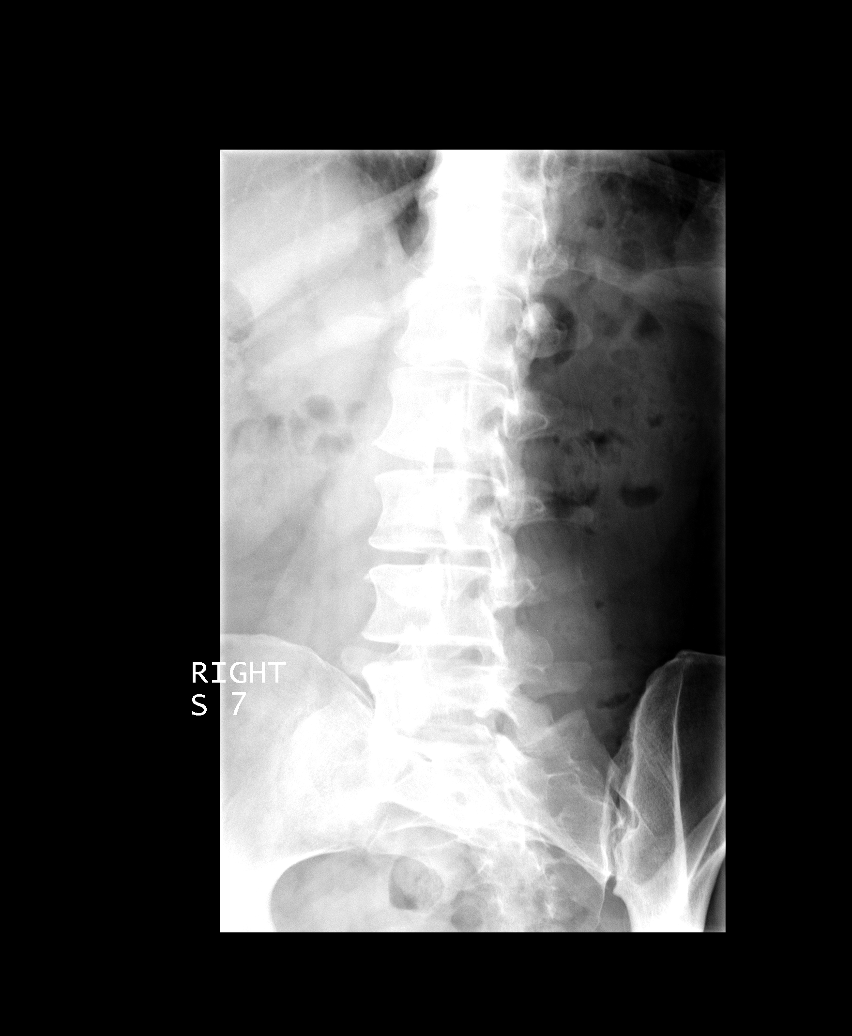

[view not recorded (3 of 5)]
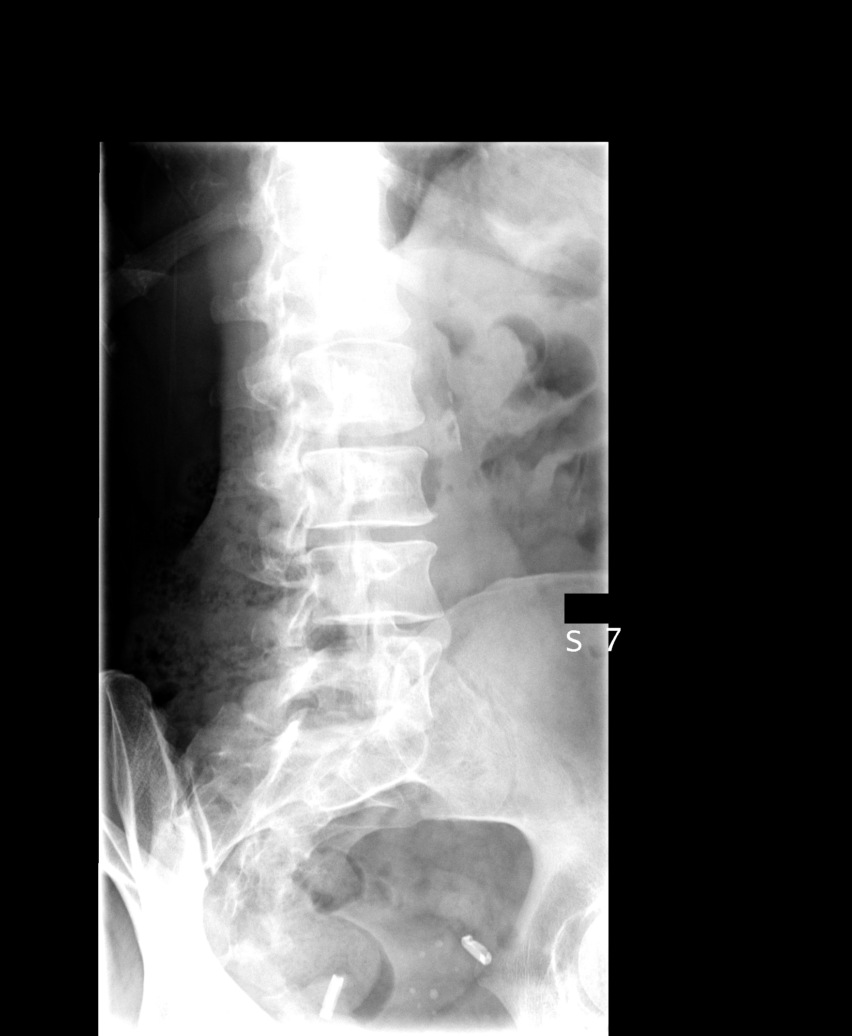

[view not recorded (4 of 5)]
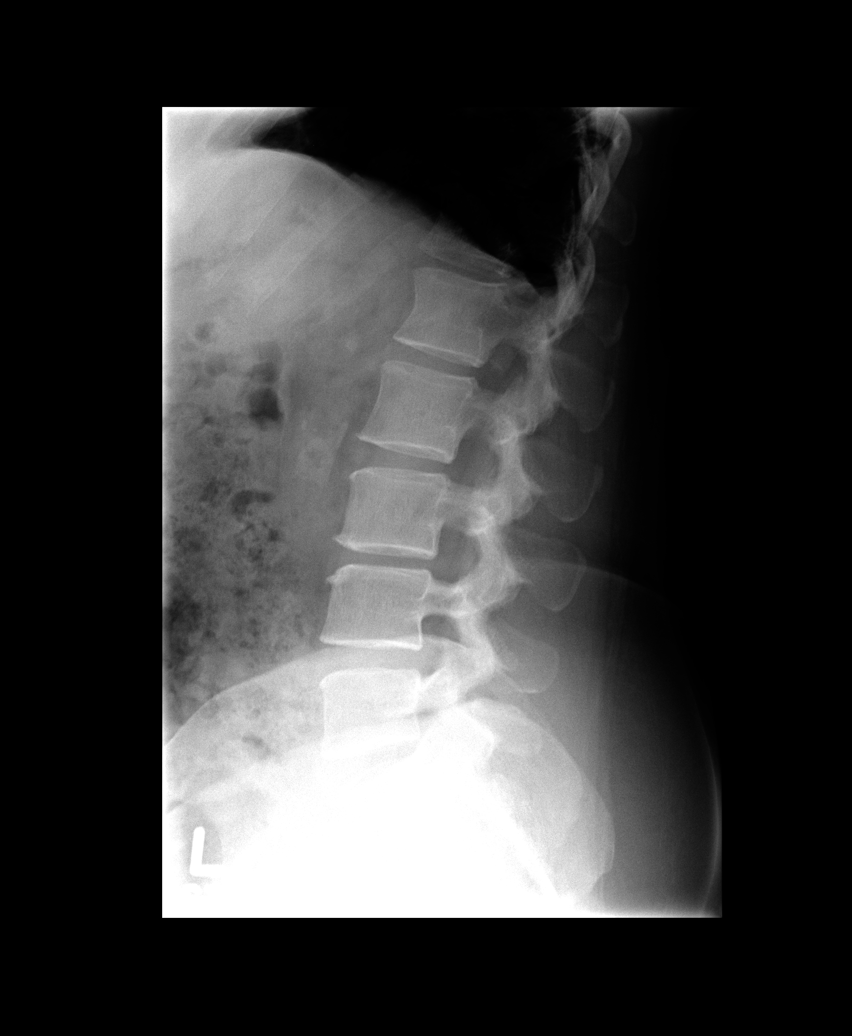

[view not recorded (5 of 5)]
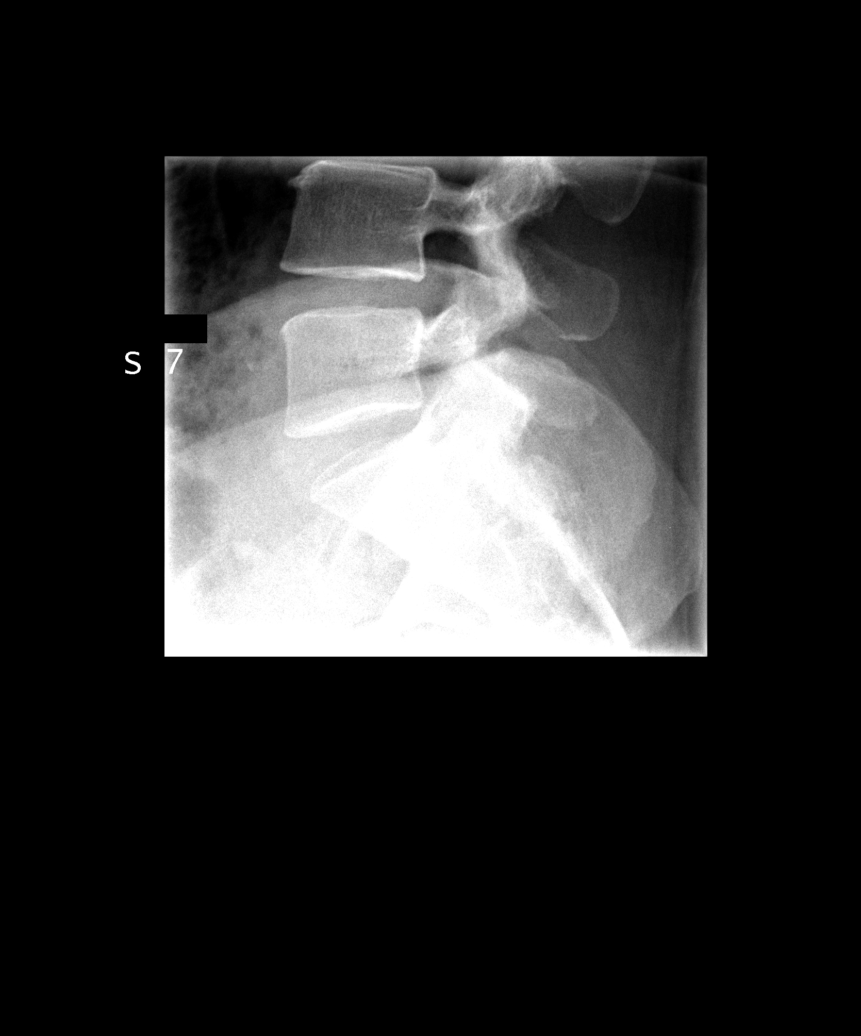

[5 of 5 positions shown; findings below may reference images not displayed]

FINDINGS: No fracture or subluxation is identified. There is some loss of disc
space height at L3-4. Paraspinous structures demonstrate tubal
ligation clips.
IMPRESSION: No acute finding.

L3-4 degenerative disc disease.

## 2014-07-30 ENCOUNTER — Encounter (HOSPITAL_COMMUNITY): Payer: Self-pay | Admitting: Emergency Medicine

## 2016-05-13 ENCOUNTER — Encounter: Payer: Self-pay | Admitting: Emergency Medicine

## 2016-05-13 ENCOUNTER — Emergency Department
Admission: EM | Admit: 2016-05-13 | Discharge: 2016-05-13 | Disposition: A | Discharge status: Home / Self Care | Payer: Medicaid Other | Attending: Emergency Medicine | Admitting: Emergency Medicine

## 2016-05-13 ENCOUNTER — Emergency Department: Payer: Medicaid Other

## 2016-05-13 DIAGNOSIS — W1839XA Other fall on same level, initial encounter: Secondary | ICD-10-CM | POA: Insufficient documentation

## 2016-05-13 DIAGNOSIS — J45909 Unspecified asthma, uncomplicated: Secondary | ICD-10-CM | POA: Diagnosis not present

## 2016-05-13 DIAGNOSIS — Y999 Unspecified external cause status: Secondary | ICD-10-CM | POA: Diagnosis not present

## 2016-05-13 DIAGNOSIS — Y9301 Activity, walking, marching and hiking: Secondary | ICD-10-CM | POA: Insufficient documentation

## 2016-05-13 DIAGNOSIS — S322XXA Fracture of coccyx, initial encounter for closed fracture: Secondary | ICD-10-CM | POA: Diagnosis not present

## 2016-05-13 DIAGNOSIS — S3992XA Unspecified injury of lower back, initial encounter: Secondary | ICD-10-CM | POA: Diagnosis present

## 2016-05-13 DIAGNOSIS — Y92096 Garden or yard of other non-institutional residence as the place of occurrence of the external cause: Secondary | ICD-10-CM | POA: Insufficient documentation

## 2016-05-13 DIAGNOSIS — S300XXA Contusion of lower back and pelvis, initial encounter: Secondary | ICD-10-CM

## 2016-05-13 DIAGNOSIS — F17210 Nicotine dependence, cigarettes, uncomplicated: Secondary | ICD-10-CM | POA: Diagnosis not present

## 2016-05-13 LAB — POCT PREGNANCY, URINE: Preg Test, Ur: NEGATIVE

## 2016-05-13 MED ORDER — NAPROXEN 500 MG PO TABS: 500.0000 mg | ORAL_TABLET | Freq: Two times a day (BID) | ORAL | 0 refills | Status: DC

## 2016-05-13 MED ORDER — PROMETHAZINE HCL 25 MG PO TABS: 25.0000 mg | ORAL_TABLET | Freq: Four times a day (QID) | ORAL | 0 refills | Status: DC | PRN

## 2016-05-13 MED ORDER — OXYCODONE-ACETAMINOPHEN 5-325 MG PO TABS: 1.0000 | ORAL_TABLET | ORAL | 0 refills | Status: DC | PRN

## 2016-05-13 MED ORDER — OXYCODONE-ACETAMINOPHEN 5-325 MG PO TABS
1.0000 | ORAL_TABLET | Freq: Once | ORAL | Status: AC
  Administered 2016-05-13: 1 via ORAL
  Filled 2016-05-13: qty 1

## 2016-05-13 NOTE — ED Notes (Signed)
See triage note  States she fell on her tailbone couple of days ago..conts to have increased pain with ambulation or sitting

## 2016-05-13 NOTE — ED Triage Notes (Signed)
Patient presents to the ED post fall two days ago.  Patient states she fell back on her butt.  Patient was walking in her uncle's yard around midnight.  Patient states she fell and sat down on a tree root.  Patient appears slightly uncomfortable.

## 2016-05-13 NOTE — ED Provider Notes (Signed)
[facility] Emergency Department Provider Note  ____________________________________________  Time seen: Approximately 5:25 PM  I have reviewed the triage vital signs and the nursing notes.   HISTORY  Chief Complaint Tailbone Pain    HPI Stephanie Warren is a 45 y.o. female who presents for evaluation oftailbone pain after falling in the yard landing on her buttock. Describes pain as a 10 over 10 at this time.   Past Medical History:  Diagnosis Date  . Abnormal Pap smear   . Asthma   . Genital HSV   . Infection    yeast    Patient Active Problem List   Diagnosis Date Noted  . HSV (herpes simplex virus) anogenital infection 11/15/2012  . Active smoker 11/15/2012    Past Surgical History:  Procedure Laterality Date  . BARTHOLIN GLAND CYST EXCISION    . CESAREAN SECTION    . CESAREAN SECTION WITH BILATERAL TUBAL LIGATION Bilateral 02/07/2013   Procedure: CESAREAN SECTION WITH BILATERAL TUBAL LIGATION;  Surgeon: Emily Filbert, MD;  Location: [facility];  Service: Obstetrics;  Laterality: Bilateral;  . CYST EXCISION PERINEAL    . DILATION AND CURETTAGE OF UTERUS     x3  . FRACTURE SURGERY    . LUNG SURGERY     left lung puncture  . RIB FRACTURE SURGERY     left side / 10 ribs broken. Horse back riding accident.    Prior to Admission medications   Medication Sig Start Date End Date Taking? Authorizing Provider  albuterol (PROVENTIL HFA;VENTOLIN HFA) 108 (90 BASE) MCG/ACT inhaler Inhale 2 puffs into the lungs every 4 (four) hours as needed for wheezing. 11/01/12   West Pugh, NP  amoxicillin-clavulanate (AUGMENTIN) 875-125 MG per tablet Take 1 tablet by mouth every 12 (twelve) hours. 08/24/13   Noland Fordyce, PA-C  guaiFENesin (MUCINEX) 600 MG 12 hr tablet Take by mouth 2 (two) times daily.    Historical Provider, MD  naproxen (NAPROSYN) 500 MG tablet Take 1 tablet (500 mg total) by mouth 2 (two) times daily with a meal. 05/13/16   Arlyss Repress,  PA-C  oxyCODONE-acetaminophen (ROXICET) 5-325 MG tablet Take 1-2 tablets by mouth every 4 (four) hours as needed for severe pain. 05/13/16   Arlyss Repress, PA-C  promethazine (PHENERGAN) 25 MG tablet Take 1 tablet (25 mg total) by mouth every 6 (six) hours as needed for nausea or vomiting. 05/13/16   Arlyss Repress, PA-C    Allergies Codeine  Family History  Problem Relation Age of Onset  . Hypertension Father   . Hyperlipidemia Father   . Cancer Father     liver, lung, bone  . Asthma Father   . Alcohol abuse Father   . Drug abuse Mother   . Hypertension Maternal Grandfather   . Hyperlipidemia Maternal Grandfather   . Cancer Maternal Grandfather     lung  . Stroke Maternal Grandfather   . Hypertension Paternal Grandfather   . Hyperlipidemia Paternal Grandfather   . Cancer Paternal Grandfather     lung and liver  . Stroke Paternal Grandfather     Social History Social History  Substance Use Topics  . Smoking status: Current Every Day Smoker    Packs/day: 0.25    Types: Cigarettes  . Smokeless tobacco: Never Used  . Alcohol use Yes     Comment: Beer . Maybe 4 amonth    Review of Systems Constitutional: No fever/chills. Genitourinary: Negative for dysuria. Musculoskeletal: Positive for tailbone pain  Skin: Negative for rash. Neurological: Negative for headaches, focal weakness or numbness.  10-point ROS otherwise negative.  ____________________________________________   PHYSICAL EXAM:  VITAL SIGNS: ED Triage Vitals [05/13/16 1610]  Enc Vitals Group     BP (!) 147/100     Pulse Rate (!) 103     Resp 18     Temp 97.4 F (36.3 C)     Temp Source Oral     SpO2 97 %     Weight 155 lb (70.3 kg)     Height 5\' 5"  (1.651 m)     Head Circumference      Peak Flow      Pain Score      Pain Loc      Pain Edu?      Excl. in [place]?     Constitutional: Alert and oriented. Well appearing and in no acute distress. Musculoskeletal:Point tenderness to the coccyx area.  Positive ecchymosis and bruising. Skin:  Skin is warm, dry and intact. No rash noted. Psychiatric: Mood and affect are normal. Speech and behavior are normal.  ____________________________________________   LABS (all labs ordered are listed, but only abnormal results are displayed)  Labs Reviewed  POC URINE PREG, ED  POCT PREGNANCY, URINE   ____________________________________________  EKG   ____________________________________________  RADIOLOGY  No definite acute finding. Angulation of the distal coccygeal segment can be seen in normal persons, but an acute coccygeal injury can also take on this appearance. Unfortunately, the coccygeal tip was not included on the lumbar radiographs of 2014. ____________________________________________   PROCEDURES  Procedure(s) performed: None  Critical Care performed: No  ____________________________________________   INITIAL IMPRESSION / ASSESSMENT AND PLAN / ED COURSE  Pertinent labs & imaging results that were available during my care of the patient were reviewed by me and considered in my medical decision making (see chart for details). Review of the [place] CSRS was performed in accordance of the [place] prior to dispensing any controlled drugs.  Positive coccyx contusion/fracture. Rx given for Percocet and Naprosyn. Work excuse given. Patient follow-up PCP or return ER with any worsening symptomology.  Clinical Course    ____________________________________________   FINAL CLINICAL IMPRESSION(S) / ED DIAGNOSES  Final diagnoses:  Coccyx contusion, initial encounter  Closed fracture of coccyx, initial encounter [facility])     This chart was dictated using voice recognition software/Dragon. Despite best efforts to proofread, errors can occur which can change the meaning. Any change was purely unintentional.    Arlyss Repress, PA-C 05/13/16 [place], MD 05/14/16 2209

## 2017-12-08 ENCOUNTER — Encounter: Payer: Self-pay | Admitting: Emergency Medicine

## 2017-12-08 ENCOUNTER — Emergency Department
Admission: EM | Admit: 2017-12-08 | Discharge: 2017-12-08 | Disposition: A | Discharge status: Home / Self Care | Payer: Medicaid Other | Attending: Student in an Organized Health Care Education/Training Program | Admitting: Student in an Organized Health Care Education/Training Program

## 2017-12-08 ENCOUNTER — Other Ambulatory Visit: Payer: Self-pay

## 2017-12-08 DIAGNOSIS — Z79899 Other long term (current) drug therapy: Secondary | ICD-10-CM | POA: Diagnosis not present

## 2017-12-08 DIAGNOSIS — M62838 Other muscle spasm: Secondary | ICD-10-CM | POA: Diagnosis not present

## 2017-12-08 DIAGNOSIS — M542 Cervicalgia: Secondary | ICD-10-CM | POA: Diagnosis present

## 2017-12-08 DIAGNOSIS — F17210 Nicotine dependence, cigarettes, uncomplicated: Secondary | ICD-10-CM | POA: Insufficient documentation

## 2017-12-08 DIAGNOSIS — J45909 Unspecified asthma, uncomplicated: Secondary | ICD-10-CM | POA: Diagnosis not present

## 2017-12-08 MED ORDER — LIDOCAINE 5 % EX PTCH
1.0000 | MEDICATED_PATCH | CUTANEOUS | Status: DC
  Administered 2017-12-08: 1 via TRANSDERMAL
  Filled 2017-12-08 (×2): qty 1

## 2017-12-08 MED ORDER — DIAZEPAM 5 MG PO TABS
5.0000 mg | ORAL_TABLET | Freq: Once | ORAL | Status: AC
  Administered 2017-12-08: 5 mg via ORAL
  Filled 2017-12-08: qty 1

## 2017-12-08 MED ORDER — BUPIVACAINE HCL (PF) 0.5 % IJ SOLN
30.0000 mL | Freq: Once | INTRAMUSCULAR | Status: AC
  Administered 2017-12-08: 30 mL
  Filled 2017-12-08 (×2): qty 30

## 2017-12-08 MED ORDER — DIAZEPAM 5 MG PO TABS: 5.0000 mg | ORAL_TABLET | Freq: Three times a day (TID) | ORAL | 0 refills | Status: AC | PRN

## 2017-12-08 MED ORDER — LIDOCAINE 5 % EX PTCH: 1.0000 | MEDICATED_PATCH | Freq: Two times a day (BID) | CUTANEOUS | 0 refills | Status: AC

## 2017-12-08 NOTE — ED Provider Notes (Signed)
[facility] Emergency Department Provider Note    None    (approximate)  I have reviewed the triage vital signs and the nursing notes.   HISTORY  Chief Complaint Back Pain; Weakness; and Torticollis    HPI Stephanie Warren is a 47 y.o. female sense of chief complaint of left-sided neck pain and shoulder pain started several days ago.  States it started developing severe neck stiffness and feels that she is unable to turn her head to the left.  Patient feels that she is also getting some weakness in her left arm due to the worsening pain and has not been able to move for the past several days.  States that she took Motrin and is just been laying in bed since then due to severe pain.  When she tries to move she gets so much pain that she will feel nauseated.  Denies any fevers.  No other numbness or tingling.  Symptoms started primarily with left shoulder and neck pain and then she developed a left arm weakness.  Denies any known trauma.  States that she felt like it started with a "crick in her neck ".  Past Medical History:  Diagnosis Date  . Abnormal Pap smear   . Asthma   . Genital HSV   . Infection    yeast   Family History  Problem Relation Age of Onset  . Hypertension Father   . Hyperlipidemia Father   . Cancer Father        liver, lung, bone  . Asthma Father   . Alcohol abuse Father   . Drug abuse Mother   . Hypertension Maternal Grandfather   . Hyperlipidemia Maternal Grandfather   . Cancer Maternal Grandfather        lung  . Stroke Maternal Grandfather   . Hypertension Paternal Grandfather   . Hyperlipidemia Paternal Grandfather   . Cancer Paternal Grandfather        lung and liver  . Stroke Paternal Grandfather    Past Surgical History:  Procedure Laterality Date  . BARTHOLIN GLAND CYST EXCISION    . CESAREAN SECTION    . CESAREAN SECTION WITH BILATERAL TUBAL LIGATION Bilateral 02/07/2013   Procedure: CESAREAN SECTION WITH  BILATERAL TUBAL LIGATION;  Surgeon: Emily Filbert, MD;  Location: [place];  Service: Obstetrics;  Laterality: Bilateral;  . CYST EXCISION PERINEAL    . DILATION AND CURETTAGE OF UTERUS     x3  . FRACTURE SURGERY    . LUNG SURGERY     left lung puncture  . RIB FRACTURE SURGERY     left side / 10 ribs broken. Horse back riding accident.   Patient Active Problem List   Diagnosis Date Noted  . HSV (herpes simplex virus) anogenital infection 11/15/2012  . Active smoker 11/15/2012      Prior to Admission medications   Medication Sig Start Date End Date Taking? Authorizing Provider  albuterol (PROVENTIL HFA;VENTOLIN HFA) 108 (90 BASE) MCG/ACT inhaler Inhale 2 puffs into the lungs every 4 (four) hours as needed for wheezing. 11/01/12   West Pugh, NP  amoxicillin-clavulanate (AUGMENTIN) 875-125 MG per tablet Take 1 tablet by mouth every 12 (twelve) hours. 08/24/13   Noe Gens, PA-C  diazepam (VALIUM) 5 MG tablet Take 1 tablet (5 mg total) by mouth every 8 (eight) hours as needed for muscle spasms. 12/08/17 12/08/18  Merlyn Lot, MD  guaiFENesin (MUCINEX) 600 MG 12 hr tablet Take by mouth 2 (  two) times daily.    [provider]  lidocaine (LIDODERM) 5 % Place 1 patch onto the skin every 12 (twelve) hours. Remove & Discard patch within 12 hours or as directed by MD 12/08/17 12/08/18  Merlyn Lot, MD  naproxen (NAPROSYN) 500 MG tablet Take 1 tablet (500 mg total) by mouth 2 (two) times daily with a meal. 05/13/16   Beers, Pierce Crane, PA-C  oxyCODONE-acetaminophen (ROXICET) 5-325 MG tablet Take 1-2 tablets by mouth every 4 (four) hours as needed for severe pain. 05/13/16   Beers, Pierce Crane, PA-C  promethazine (PHENERGAN) 25 MG tablet Take 1 tablet (25 mg total) by mouth every 6 (six) hours as needed for nausea or vomiting. 05/13/16   Beers, Pierce Crane, PA-C    Allergies Patient has no known allergies.    Social History Social History   Tobacco Use  . Smoking status:  Current Every Day Smoker    Packs/day: 0.25    Types: Cigarettes  . Smokeless tobacco: Never Used  Substance Use Topics  . Alcohol use: Yes    Comment: Beer . Maybe 4 amonth  . Drug use: No    Review of Systems Patient denies headaches, rhinorrhea, blurry vision, numbness, shortness of breath, chest pain, edema, cough, abdominal pain, nausea, vomiting, diarrhea, dysuria, fevers, rashes or hallucinations unless otherwise stated above in HPI. ____________________________________________   PHYSICAL EXAM:  VITAL SIGNS: Vitals:   12/08/17 1429 12/08/17 1515  BP: (!) 148/100 (!) 137/101  Pulse: 85 67  Resp: 18 16  Temp: 98.1 F (36.7 C)   SpO2: 100% 100%    Constitutional: Alert and oriented. Well appearing but with held fixed to right side Eyes: Conjunctivae are normal.  Head: Atraumatic. Nose: No congestion/rhinnorhea. Mouth/Throat: Mucous membranes are moist.   Neck: No stridor. Painless ROM.  Cardiovascular: Normal rate, regular rhythm. Grossly normal heart sounds.  Good peripheral circulation. Respiratory: Normal respiratory effort.  No retractions. Lungs CTAB. Gastrointestinal: Soft and nontender. No distention. No abdominal bruits. No CVA tenderness. Genitourinary:  Musculoskeletal: Significant point tenderness palpation over the medial aspect of the scapula migrating up along the trapezius. no lower extremity tenderness nor edema.  No joint effusions. Neurologic:  Normal speech and language. No gross focal neurologic deficits are appreciated. No facial droop Skin:  Skin is warm, dry and intact. No rash noted. Psychiatric: Mood and affect are normal. Speech and behavior are normal.  ____________________________________________   LABS (all labs ordered are listed, but only abnormal results are displayed)  No results found for this or any previous visit (from the past 24 hour(s)). ____________________________________________  EKG My review and personal  interpretation at Time: 14:58   Indication: shoulder pain  Rate: 80  Rhythm: sinus Axis: normal Other: normal intervals, no stemi ____________________________________________  RADIOLOGY   ____________________________________________   PROCEDURES  Procedure(s) performed:  Procedures Procedure: Trigger point injection. The patient agreed to the procedure without reservation, and acknowledging the risks of infection, nerve damage, damage to internal organs, lack of efficacy, bleeding. The left posterior shoulder was prepped with chloraprep and allowed to dry. A 10 cc syringe was loaded with 0.5% Bupivacaine and a 27 ga needle advanced toward the areas of discomfort The plunger was withdrawn before injection. The patient tolerated the procedure well.    Critical Care performed: no ____________________________________________   INITIAL IMPRESSION / ASSESSMENT AND PLAN / ED COURSE  Pertinent labs & imaging results that were available during my care of the patient were reviewed by me and considered in  my medical decision making (see chart for details).  DDX: Torticollis, musculoskeletal strain, spasm, dissection, ACS, radiculopathy  Stephanie Warren is a 74 y.o. who presents to the ED with pain as described above.  Most clinically consistent with acute callus.  No evidence of ischemia.  Not clinically consistent with ACS or dissection.  No respiratory symptoms.  Patient with significant improvement after trigger point injection as well as Valium.  Now up ambulating about the room looking about in no acute distress.  States she feels symptoms of nearly completely abated.  Patient stable and appropriate for outpatient follow-up.      ____________________________________________   FINAL CLINICAL IMPRESSION(S) / ED DIAGNOSES  Final diagnoses:  Muscle spasm of left shoulder      NEW MEDICATIONS STARTED DURING THIS VISIT:  New Prescriptions   DIAZEPAM (VALIUM) 5 MG TABLET    Take  1 tablet (5 mg total) by mouth every 8 (eight) hours as needed for muscle spasms.   LIDOCAINE (LIDODERM) 5 %    Place 1 patch onto the skin every 12 (twelve) hours. Remove & Discard patch within 12 hours or as directed by MD     Note:  This document was prepared using Dragon voice recognition software and may include unintentional dictation errors.    Merlyn Lot, MD 12/08/17 1700

## 2017-12-08 NOTE — ED Notes (Signed)
Pt signed esignature.  D/c  inst to pt.  

## 2017-12-08 NOTE — ED Triage Notes (Signed)
Pt reports that three days ago, she developed neck stiffness, two days ago she states that she developed pain in the middle of her back and is unable to move her neck her shoulder and her left arm.

## 2017-12-08 NOTE — ED Triage Notes (Signed)
States that when she moves her left arm the pain shoots up her neck and into her head.

## 2022-05-07 ENCOUNTER — Ambulatory Visit: Payer: Medicaid Other | Admitting: Nurse Practitioner

## 2023-01-07 DIAGNOSIS — J0100 Acute maxillary sinusitis, unspecified: Secondary | ICD-10-CM | POA: Diagnosis not present

## 2023-11-03 DIAGNOSIS — R070 Pain in throat: Secondary | ICD-10-CM | POA: Diagnosis not present

## 2023-11-03 DIAGNOSIS — Z20822 Contact with and (suspected) exposure to covid-19: Secondary | ICD-10-CM | POA: Diagnosis not present

## 2023-11-03 DIAGNOSIS — L409 Psoriasis, unspecified: Secondary | ICD-10-CM | POA: Diagnosis not present

## 2023-11-03 DIAGNOSIS — R590 Localized enlarged lymph nodes: Secondary | ICD-10-CM | POA: Diagnosis not present

## 2023-11-03 DIAGNOSIS — J069 Acute upper respiratory infection, unspecified: Secondary | ICD-10-CM | POA: Diagnosis not present

## 2023-12-02 DIAGNOSIS — F319 Bipolar disorder, unspecified: Secondary | ICD-10-CM | POA: Diagnosis not present

## 2023-12-02 DIAGNOSIS — Z780 Asymptomatic menopausal state: Secondary | ICD-10-CM | POA: Diagnosis not present

## 2023-12-02 DIAGNOSIS — F418 Other specified anxiety disorders: Secondary | ICD-10-CM | POA: Diagnosis not present

## 2023-12-02 DIAGNOSIS — Z1331 Encounter for screening for depression: Secondary | ICD-10-CM | POA: Diagnosis not present

## 2023-12-02 DIAGNOSIS — Z1389 Encounter for screening for other disorder: Secondary | ICD-10-CM | POA: Diagnosis not present

## 2023-12-22 DIAGNOSIS — R590 Localized enlarged lymph nodes: Secondary | ICD-10-CM | POA: Diagnosis not present

## 2023-12-29 ENCOUNTER — Other Ambulatory Visit: Payer: Self-pay | Admitting: Otolaryngology

## 2023-12-29 DIAGNOSIS — R599 Enlarged lymph nodes, unspecified: Secondary | ICD-10-CM | POA: Diagnosis not present

## 2023-12-29 DIAGNOSIS — K219 Gastro-esophageal reflux disease without esophagitis: Secondary | ICD-10-CM | POA: Diagnosis not present

## 2023-12-29 DIAGNOSIS — R221 Localized swelling, mass and lump, neck: Secondary | ICD-10-CM

## 2024-01-01 DIAGNOSIS — R350 Frequency of micturition: Secondary | ICD-10-CM | POA: Diagnosis not present

## 2024-01-01 DIAGNOSIS — R109 Unspecified abdominal pain: Secondary | ICD-10-CM | POA: Diagnosis not present

## 2024-01-01 DIAGNOSIS — R21 Rash and other nonspecific skin eruption: Secondary | ICD-10-CM | POA: Diagnosis not present

## 2024-01-04 ENCOUNTER — Ambulatory Visit
Admission: RE | Admit: 2024-01-04 | Discharge: 2024-01-04 | Disposition: A | Discharge status: Home / Self Care | Source: Ambulatory Visit | Attending: Otolaryngology | Admitting: Otolaryngology

## 2024-01-04 DIAGNOSIS — R221 Localized swelling, mass and lump, neck: Secondary | ICD-10-CM

## 2024-01-04 MED ORDER — IOPAMIDOL (ISOVUE-300) INJECTION 61%
75.0000 mL | Freq: Once | INTRAVENOUS | Status: AC | PRN
  Administered 2024-01-04: 75 mL via INTRAVENOUS

## 2024-01-09 DIAGNOSIS — J069 Acute upper respiratory infection, unspecified: Secondary | ICD-10-CM | POA: Diagnosis not present

## 2024-01-09 DIAGNOSIS — R070 Pain in throat: Secondary | ICD-10-CM | POA: Diagnosis not present

## 2024-01-13 ENCOUNTER — Other Ambulatory Visit: Payer: Self-pay

## 2024-01-13 ENCOUNTER — Emergency Department
Admission: EM | Admit: 2024-01-13 | Discharge: 2024-01-13 | Disposition: A | Discharge status: Home / Self Care | Attending: Emergency Medicine | Admitting: Emergency Medicine

## 2024-01-13 DIAGNOSIS — R590 Localized enlarged lymph nodes: Secondary | ICD-10-CM | POA: Insufficient documentation

## 2024-01-13 DIAGNOSIS — R9431 Abnormal electrocardiogram [ECG] [EKG]: Secondary | ICD-10-CM | POA: Diagnosis not present

## 2024-01-13 DIAGNOSIS — Z1389 Encounter for screening for other disorder: Secondary | ICD-10-CM | POA: Diagnosis not present

## 2024-01-13 DIAGNOSIS — R21 Rash and other nonspecific skin eruption: Secondary | ICD-10-CM | POA: Insufficient documentation

## 2024-01-13 DIAGNOSIS — R221 Localized swelling, mass and lump, neck: Secondary | ICD-10-CM | POA: Diagnosis not present

## 2024-01-13 DIAGNOSIS — Z1331 Encounter for screening for depression: Secondary | ICD-10-CM | POA: Diagnosis not present

## 2024-01-13 LAB — CBC
HCT: 41.4 % (ref 36.0–46.0)
Hemoglobin: 13.5 g/dL (ref 12.0–15.0)
MCH: 31.7 pg (ref 26.0–34.0)
MCHC: 32.6 g/dL (ref 30.0–36.0)
MCV: 97.2 fL (ref 80.0–100.0)
Platelets: 374 10*3/uL (ref 150–400)
RBC: 4.26 MIL/uL (ref 3.87–5.11)
RDW: 12.8 % (ref 11.5–15.5)
WBC: 8.9 10*3/uL (ref 4.0–10.5)
nRBC: 0 % (ref 0.0–0.2)

## 2024-01-13 LAB — BASIC METABOLIC PANEL WITH GFR
Anion gap: 7 (ref 5–15)
BUN: 24 mg/dL — ABNORMAL HIGH (ref 6–20)
CO2: 26 mmol/L (ref 22–32)
Calcium: 9.4 mg/dL (ref 8.9–10.3)
Chloride: 106 mmol/L (ref 98–111)
Creatinine, Ser: 0.71 mg/dL (ref 0.44–1.00)
GFR, Estimated: >60 mL/min (ref >60)
Glucose, Bld: 92 mg/dL (ref 70–99)
Potassium: 4.1 mmol/L (ref 3.5–5.1)
Sodium: 139 mmol/L (ref 135–145)

## 2024-01-13 LAB — SEDIMENTATION RATE: Sed Rate: 6 mm/h (ref 0–30)

## 2024-01-13 MED ORDER — LIDOCAINE VISCOUS HCL 2 % MT SOLN: 15.0000 mL | OROMUCOSAL | 0 refills | Status: DC | PRN

## 2024-01-13 MED ORDER — PREDNISONE 10 MG (21) PO TBPK: ORAL_TABLET | ORAL | 0 refills | Status: DC

## 2024-01-13 MED ORDER — HYDROCODONE-ACETAMINOPHEN 5-325 MG PO TABS: 1.0000 | ORAL_TABLET | Freq: Four times a day (QID) | ORAL | 0 refills | Status: AC | PRN

## 2024-01-13 NOTE — ED Provider Notes (Signed)
 [facility] Provider Note    Event Date/Time   First MD Initiated Contact with Patient 01/13/24 1412     (approximate)   History   Rash   HPI  Stephanie Warren is a 53 y.o. female  with no significant past medical history and as listed in EMR presents to the emergency department for evaluation of rash on her face that has been intermittent for the past few months.  Areas start as firm round lesions that are pruritic then "look like ringworm" with a round border and a clear center.  After this phase, lesions get red.  With each incidence of the rash the number of lesions increases.  With this episode, lesions are larger and more widespread.  She is also experiencing pain with swallowing and states that the sore throat gets worse with any type of acidic foods or carbonation.  She has been evaluated by primary care and ENT.  She had a CT of the neck but does not yet know the results.      Physical Exam   Triage Vital Signs: ED Triage Vitals  Encounter Vitals Group     BP 01/13/24 1247 (!) 146/110     Systolic BP Percentile --      Diastolic BP Percentile --      Pulse Rate 01/13/24 1247 78     Resp 01/13/24 1247 18     Temp 01/13/24 1247 97.9 F (36.6 C)     Temp Source 01/13/24 1247 Oral     SpO2 01/13/24 1247 97 %     Weight 01/13/24 1252 149 lb 14.6 oz (68 kg)     Height 01/13/24 1252 5\' 5"  (1.651 m)     Head Circumference --      Peak Flow --      Pain Score 01/13/24 1252 8     Pain Loc --      Pain Education --      Exclude from Growth Chart --     Most recent vital signs: Vitals:   01/13/24 1415 01/13/24 1659  BP:  (!) 138/90  Pulse: 62 60  Resp:  18  Temp:  98 F (36.7 C)  SpO2: 100% 100%    General: Awake, no distress.  CV:  Good peripheral perfusion.  Resp:  Normal effort.  Abd:  No distention.  Other:  Indurated, erythematous,  annular lesions noted across the forehead and right side of the face.  Anterior cervical lymph  nodes palpable.  Right greater than left.   ED Results / Procedures / Treatments   Labs (all labs ordered are listed, but only abnormal results are displayed) Labs Reviewed  BASIC METABOLIC PANEL WITH GFR - Abnormal; Notable for the following components:      Result Value   BUN 24 (*)    All other components within normal limits  CBC  SEDIMENTATION RATE  ANA W/REFLEX     EKG  Not indicated   RADIOLOGY  Image and radiology report reviewed and interpreted by me. Radiology report consistent with the same.  Not indicated  PROCEDURES:  Critical Care performed: No  Procedures   MEDICATIONS ORDERED IN ED:  Medications - No data to display   IMPRESSION / MDM / ASSESSMENT AND PLAN / ED COURSE   I have reviewed the triage note.  Differential diagnosis includes, but is not limited to, contact dermatitis, eczema, connective tissue disorder, lupus  Patient's presentation is most consistent with acute illness /  injury with system symptoms.  53 year old female presenting to the emergency department for treatment and evaluation of rash and sore throat.  See HPI for further details.  Labs are reassuring.  Sed rate is normal.  No leukocytosis.  No electrolyte disorders.  ANA with reflex is pending.  Outside records reviewed including note from Dr. Celso College with ENT.  CT soft tissue neck with contrast shows a 1.5 x 3 cm right soft tissue neck mass likely an abnormal lymph node at the junction of the 2A/3 station.  Mass has a rounded internal cystic or necrotic component.  Results reviewed with the patient.  Plan will be to discharge her home with prednisone  taper and have her follow-up with ENT and dermatology.  She states that her primary care provider has entered a referral for dermatology but has not yet received a call for an appointment.  ER return precautions discussed.      FINAL CLINICAL IMPRESSION(S) / ED DIAGNOSES   Final diagnoses:  Rash and nonspecific skin  eruption  Lymphadenopathy of right cervical region     Rx / DC Orders   ED Discharge Orders          Ordered    predniSONE  (STERAPRED UNI-PAK 21 TAB) 10 MG (21) TBPK tablet        01/13/24 1652    lidocaine  (XYLOCAINE ) 2 % solution  As needed        01/13/24 1652    HYDROcodone -acetaminophen  (NORCO/VICODIN) 5-325 MG tablet  Every 6 hours PRN        01/13/24 1652             Note:  This document was prepared using Dragon voice recognition software and may include unintentional dictation errors.   Sherryle Don, FNP 01/15/24 1354    Claria Crofts, MD 01/15/24 [phone]

## 2024-01-13 NOTE — Discharge Instructions (Signed)
 Please follow up with ENT and dermatology.  Return to the ER for symptoms of concern if unable to see primary care or the specialists.

## 2024-01-13 NOTE — ED Triage Notes (Signed)
 Pt here with a rash all over her face that is now spreading. Pt states the red areas are itching and severely painful. Pt states this all started a year ago when she had a sore throat. Pt states she woke up this morning gagging for air. Pt talking in complete sentences. Pt denies fevers at home. Pt endorses weakness as well.

## 2024-01-13 NOTE — ED Notes (Addendum)
 Pt states that she has a lump on the right side of her throat that has been present for over an year, but has recently became painful to swallow, and feels like she having difficulty breathing. Pt reports that it is painful for her to swallow. Pt has a rash on the right side of her forehead. Pt states rash has been present but has got worse over the past 48 hours.

## 2024-01-15 LAB — ANA W/REFLEX: Anti Nuclear Antibody (ANA): NEGATIVE

## 2024-01-18 NOTE — Progress Notes (Signed)
 Myrlene Asper, DO sent to [facility]; P [facility] Procedure Requests OK for US  guided FNA right cervical node, possible core biopsy.  CT neck 01/04/24, #[id number]    Stephanie Warren

## 2024-01-19 ENCOUNTER — Other Ambulatory Visit: Payer: Self-pay | Admitting: Otolaryngology

## 2024-01-19 DIAGNOSIS — R599 Enlarged lymph nodes, unspecified: Secondary | ICD-10-CM

## 2024-01-24 NOTE — Progress Notes (Signed)
 Patient for US  guided FNA RT of cervical LN biopsy on Tues 01/25/24, I called and spoke with the patient on the phone and gave pre-procedure instructions. Pt was made aware to be here at 12:30p and check in at the [facility] registration desk, NPO after MN prior to procedure as well as driver post procedure/recovery/discharge. Pt stated understanding.  Called 01/19/24

## 2024-01-25 ENCOUNTER — Ambulatory Visit
Admission: RE | Admit: 2024-01-25 | Discharge: 2024-01-25 | Disposition: A | Discharge status: Home / Self Care | Source: Ambulatory Visit | Attending: Otolaryngology | Admitting: Otolaryngology

## 2024-01-25 ENCOUNTER — Other Ambulatory Visit: Payer: Self-pay | Admitting: Otolaryngology

## 2024-01-25 DIAGNOSIS — R590 Localized enlarged lymph nodes: Secondary | ICD-10-CM | POA: Insufficient documentation

## 2024-01-25 DIAGNOSIS — R599 Enlarged lymph nodes, unspecified: Secondary | ICD-10-CM | POA: Diagnosis not present

## 2024-01-25 MED ORDER — SODIUM CHLORIDE 0.9 % IV SOLN: INTRAVENOUS | Status: DC

## 2024-01-25 MED ORDER — LIDOCAINE HCL (PF) 2 % IJ SOLN
10.0000 mL | Freq: Once | INTRAMUSCULAR | Status: AC
  Administered 2024-01-25: 10 mL via INTRADERMAL
  Filled 2024-01-25: qty 10

## 2024-01-25 NOTE — Procedures (Signed)
 Interventional Radiology Procedure:   Indications: Right cervical lymphadenopathy  Procedure: US  guided FNA and core biopsy of right cervical lymph node  Findings: Enlarged right submandibular lymph node with small fluid component.  1 ml of yellow fluid aspirated from lymph node and then core biopsies obtained from the node.   Complications: None     EBL: Minimal  Plan: Discharge to home.   Sorin Frimpong R. Julietta Ogren, MD  Pager: [phone]

## 2024-01-27 LAB — SURGICAL PATHOLOGY

## 2024-01-30 LAB — AEROBIC/ANAEROBIC CULTURE W GRAM STAIN (SURGICAL/DEEP WOUND): Culture: NO GROWTH

## 2024-02-04 ENCOUNTER — Other Ambulatory Visit: Payer: Self-pay | Admitting: Otolaryngology

## 2024-02-09 ENCOUNTER — Other Ambulatory Visit: Payer: Self-pay

## 2024-02-09 ENCOUNTER — Encounter
Admission: RE | Admit: 2024-02-09 | Discharge: 2024-02-09 | Disposition: A | Discharge status: Home / Self Care | Source: Ambulatory Visit | Attending: Otolaryngology | Admitting: Otolaryngology

## 2024-02-09 VITALS — Ht 64.0 in | Wt 170.0 lb

## 2024-02-09 DIAGNOSIS — Z01812 Encounter for preprocedural laboratory examination: Secondary | ICD-10-CM

## 2024-02-09 HISTORY — DX: Bipolar disorder, unspecified: F31.9

## 2024-02-09 HISTORY — DX: Polycystic ovarian syndrome: E28.2

## 2024-02-09 HISTORY — DX: Depression, unspecified: F32.A

## 2024-02-09 HISTORY — DX: Localized enlarged lymph nodes: R59.0

## 2024-02-09 HISTORY — DX: Anxiety disorder, unspecified: F41.9

## 2024-02-09 NOTE — Patient Instructions (Addendum)
 Your procedure is scheduled on: Wednesday, May 21 Report to the Registration Desk on the 1st floor of the [facility] To find out your arrival time, please call [phone] between 1PM - 3PM on: Tuesday, May 20 If your arrival time is 6:00 am, do not arrive before that time as the [facility] entrance doors do not open until 6:00 am.  REMEMBER: Instructions that are not followed completely may result in serious medical risk, up to and including death; or upon the discretion of your surgeon and anesthesiologist your surgery may need to be rescheduled.  Do not eat food after midnight the night before surgery.  No gum chewing or hard candies.  You may however, drink CLEAR liquids up to 2 hours before you are scheduled to arrive for your surgery. Do not drink anything within 2 hours of your scheduled arrival time.  Clear liquids include: - water  - apple juice without pulp - gatorade (not RED colors) - black coffee or tea (Do NOT add milk or creamers to the coffee or tea) Do NOT drink anything that is not on this list.  One week prior to surgery: starting May 14 Stop Anti-inflammatories (NSAIDS) such as Advil , Aleve , Ibuprofen , Motrin , Naproxen , Naprosyn  and Aspirin based products such as Excedrin, Goody's Powder, BC Powder. Stop ANY OVER THE COUNTER supplements until after surgery. Stop black cohosh, vitamin D, vitamin B, zinc.  You may however, continue to take Tylenol  if needed for pain up until the day of surgery.  Continue taking all of your other prescription medications up until the day of surgery.  ON THE DAY OF SURGERY ONLY TAKE THESE MEDICATIONS WITH SIPS OF WATER:  Omeprazole (Prilosec)  No Alcohol for 24 hours before or after surgery.  No Smoking including e-cigarettes for 24 hours before surgery.  No chewable tobacco products for at least 6 hours before surgery.  No nicotine patches on the day of surgery.  Do not use any "recreational" drugs for at least a week  (preferably 2 weeks) before your surgery.  Please be advised that the combination of cocaine and anesthesia may have negative outcomes, up to and including death. If you test positive for cocaine, your surgery will be cancelled.  On the morning of surgery brush your teeth with toothpaste and water, you may rinse your mouth with mouthwash if you wish. Do not swallow any toothpaste or mouthwash.  Do not wear jewelry, make-up, hairpins, clips or nail polish.  For welded (permanent) jewelry: bracelets, anklets, waist bands, etc.  Please have this removed prior to surgery.  If it is not removed, there is a chance that hospital personnel will need to cut it off on the day of surgery.  Do not wear lotions, powders, or perfumes.   Shower using antibacterial soap before coming to the hospital on the day of surgery.  Do not shave body hair from the neck down 48 hours before surgery.  Contact lenses, hearing aids and dentures may not be worn into surgery.  Do not bring valuables to the hospital. [facility] is not responsible for any missing/lost belongings or valuables.   Notify your doctor if there is any change in your medical condition (cold, fever, infection).  Wear comfortable clothing (specific to your surgery type) to the hospital.  After surgery, you can help prevent lung complications by doing breathing exercises.  Take deep breaths and cough every 1-2 hours.   If you are being discharged the day of surgery, you will not be  allowed to drive home. You will need a responsible individual to drive you home and stay with you for 24 hours after surgery.   If you are taking public transportation, you will need to have a responsible individual with you.  Please call the Pre-admissions Testing Dept. at [phone] if you have any questions about these instructions.  Surgery Visitation Policy:  Patients having surgery or a procedure may have two visitors.  Children under the age of 36  must have an adult with them who is not the patient.

## 2024-02-10 DIAGNOSIS — R221 Localized swelling, mass and lump, neck: Secondary | ICD-10-CM | POA: Diagnosis not present

## 2024-02-10 DIAGNOSIS — R070 Pain in throat: Secondary | ICD-10-CM | POA: Diagnosis not present

## 2024-02-15 MED ORDER — ORAL CARE MOUTH RINSE: 15.0000 mL | Freq: Once | OROMUCOSAL | Status: AC

## 2024-02-15 MED ORDER — CHLORHEXIDINE GLUCONATE 0.12 % MT SOLN
15.0000 mL | Freq: Once | OROMUCOSAL | Status: AC
  Administered 2024-02-16: 15 mL via OROMUCOSAL

## 2024-02-15 MED ORDER — LACTATED RINGERS IV SOLN: INTRAVENOUS | Status: DC

## 2024-02-16 ENCOUNTER — Ambulatory Visit: Admitting: Anesthesiology

## 2024-02-16 ENCOUNTER — Other Ambulatory Visit: Payer: Self-pay

## 2024-02-16 ENCOUNTER — Encounter: Payer: Self-pay | Admitting: Otolaryngology

## 2024-02-16 ENCOUNTER — Encounter
Admission: RE | Disposition: A | Discharge status: Home / Self Care | Payer: Self-pay | Source: Home / Self Care | Attending: Otolaryngology

## 2024-02-16 ENCOUNTER — Ambulatory Visit
Admission: RE | Admit: 2024-02-16 | Discharge: 2024-02-16 | Disposition: A | Discharge status: Home / Self Care | Attending: Otolaryngology | Admitting: Otolaryngology

## 2024-02-16 DIAGNOSIS — Z01812 Encounter for preprocedural laboratory examination: Secondary | ICD-10-CM

## 2024-02-16 DIAGNOSIS — C760 Malignant neoplasm of head, face and neck: Secondary | ICD-10-CM | POA: Diagnosis not present

## 2024-02-16 DIAGNOSIS — R599 Enlarged lymph nodes, unspecified: Secondary | ICD-10-CM | POA: Diagnosis present

## 2024-02-16 DIAGNOSIS — C770 Secondary and unspecified malignant neoplasm of lymph nodes of head, face and neck: Secondary | ICD-10-CM | POA: Insufficient documentation

## 2024-02-16 DIAGNOSIS — F17200 Nicotine dependence, unspecified, uncomplicated: Secondary | ICD-10-CM | POA: Insufficient documentation

## 2024-02-16 DIAGNOSIS — J449 Chronic obstructive pulmonary disease, unspecified: Secondary | ICD-10-CM | POA: Diagnosis not present

## 2024-02-16 DIAGNOSIS — R591 Generalized enlarged lymph nodes: Secondary | ICD-10-CM | POA: Diagnosis not present

## 2024-02-16 HISTORY — PX: EXCISION MASS NECK: SHX6703

## 2024-02-16 LAB — POCT PREGNANCY, URINE: Preg Test, Ur: NEGATIVE

## 2024-02-16 SURGERY — EXCISION, MASS, NECK
Anesthesia: General | Laterality: Right

## 2024-02-16 MED ORDER — OXYCODONE HCL 5 MG/5ML PO SOLN: 5.0000 mg | Freq: Once | ORAL | Status: AC | PRN

## 2024-02-16 MED ORDER — CHLORHEXIDINE GLUCONATE 0.12 % MT SOLN
OROMUCOSAL | Status: AC
  Filled 2024-02-16: qty 15

## 2024-02-16 MED ORDER — BACITRACIN ZINC 500 UNIT/GM EX OINT
TOPICAL_OINTMENT | CUTANEOUS | Status: AC
  Filled 2024-02-16: qty 28.35

## 2024-02-16 MED ORDER — BACITRACIN ZINC 500 UNIT/GM EX OINT
TOPICAL_OINTMENT | CUTANEOUS | Status: DC | PRN
  Administered 2024-02-16: 1 via TOPICAL

## 2024-02-16 MED ORDER — MIDAZOLAM HCL 2 MG/2ML IJ SOLN
INTRAMUSCULAR | Status: AC
  Filled 2024-02-16: qty 2

## 2024-02-16 MED ORDER — REMIFENTANIL HCL 1 MG IV SOLR
INTRAVENOUS | Status: AC
  Filled 2024-02-16: qty 1000

## 2024-02-16 MED ORDER — ACETAMINOPHEN 10 MG/ML IV SOLN
INTRAVENOUS | Status: AC
  Filled 2024-02-16: qty 100

## 2024-02-16 MED ORDER — FENTANYL CITRATE (PF) 100 MCG/2ML IJ SOLN
INTRAMUSCULAR | Status: AC
  Filled 2024-02-16: qty 2

## 2024-02-16 MED ORDER — ONDANSETRON HCL 4 MG/2ML IJ SOLN
INTRAMUSCULAR | Status: DC | PRN
  Administered 2024-02-16: 4 mg via INTRAVENOUS

## 2024-02-16 MED ORDER — LIDOCAINE-EPINEPHRINE (PF) 1 %-1:200000 IJ SOLN
INTRAMUSCULAR | Status: AC
  Filled 2024-02-16: qty 30

## 2024-02-16 MED ORDER — DEXAMETHASONE SODIUM PHOSPHATE 10 MG/ML IJ SOLN
INTRAMUSCULAR | Status: DC | PRN
  Administered 2024-02-16: 10 mg via INTRAVENOUS

## 2024-02-16 MED ORDER — SUCCINYLCHOLINE CHLORIDE 200 MG/10ML IV SOSY
PREFILLED_SYRINGE | INTRAVENOUS | Status: DC | PRN
  Administered 2024-02-16: 80 mg via INTRAVENOUS

## 2024-02-16 MED ORDER — MIDAZOLAM HCL 2 MG/2ML IJ SOLN
INTRAMUSCULAR | Status: DC | PRN
  Administered 2024-02-16: 4 mg via INTRAVENOUS

## 2024-02-16 MED ORDER — LIDOCAINE-EPINEPHRINE (PF) 1 %-1:200000 IJ SOLN
INTRAMUSCULAR | Status: DC | PRN
  Administered 2024-02-16: 7 mL

## 2024-02-16 MED ORDER — FENTANYL CITRATE (PF) 100 MCG/2ML IJ SOLN
INTRAMUSCULAR | Status: AC
Start: 2024-02-16 — End: ?
  Filled 2024-02-16: qty 2

## 2024-02-16 MED ORDER — PROPOFOL 10 MG/ML IV BOLUS
INTRAVENOUS | Status: DC | PRN
  Administered 2024-02-16: 160 mg via INTRAVENOUS

## 2024-02-16 MED ORDER — OXYCODONE HCL 5 MG PO TABS
ORAL_TABLET | ORAL | Status: AC
  Filled 2024-02-16: qty 1

## 2024-02-16 MED ORDER — REMIFENTANIL HCL 2 MG IV SOLR
INTRAVENOUS | Status: DC | PRN
  Administered 2024-02-16: .2 ug/kg/min via INTRAVENOUS

## 2024-02-16 MED ORDER — LACTATED RINGERS IV SOLN: INTRAVENOUS | Status: DC | PRN

## 2024-02-16 MED ORDER — HYDROCODONE-ACETAMINOPHEN 5-325 MG PO TABS: 1.0000 | ORAL_TABLET | Freq: Four times a day (QID) | ORAL | 0 refills | Status: AC | PRN

## 2024-02-16 MED ORDER — EPHEDRINE SULFATE-NACL 50-0.9 MG/10ML-% IV SOSY
PREFILLED_SYRINGE | INTRAVENOUS | Status: DC | PRN
  Administered 2024-02-16: 15 mg via INTRAVENOUS

## 2024-02-16 MED ORDER — OXYCODONE HCL 5 MG PO TABS
5.0000 mg | ORAL_TABLET | Freq: Once | ORAL | Status: AC | PRN
  Administered 2024-02-16: 5 mg via ORAL

## 2024-02-16 MED ORDER — PHENYLEPHRINE 80 MCG/ML (10ML) SYRINGE FOR IV PUSH (FOR BLOOD PRESSURE SUPPORT)
PREFILLED_SYRINGE | INTRAVENOUS | Status: DC | PRN
Start: 2024-02-16 — End: 2024-02-16
  Administered 2024-02-16 (×2): 80 ug via INTRAVENOUS

## 2024-02-16 MED ORDER — SEVOFLURANE IN SOLN
RESPIRATORY_TRACT | Status: AC
  Filled 2024-02-16: qty 250

## 2024-02-16 MED ORDER — LIDOCAINE HCL (CARDIAC) PF 100 MG/5ML IV SOSY
PREFILLED_SYRINGE | INTRAVENOUS | Status: DC | PRN
  Administered 2024-02-16: 100 mg via INTRAVENOUS

## 2024-02-16 MED ORDER — FENTANYL CITRATE (PF) 100 MCG/2ML IJ SOLN
INTRAMUSCULAR | Status: DC | PRN
  Administered 2024-02-16: 50 ug via INTRAVENOUS

## 2024-02-16 MED ORDER — ALBUTEROL SULFATE HFA 108 (90 BASE) MCG/ACT IN AERS
INHALATION_SPRAY | RESPIRATORY_TRACT | Status: DC | PRN
  Administered 2024-02-16: 6 via RESPIRATORY_TRACT

## 2024-02-16 MED ORDER — FENTANYL CITRATE (PF) 100 MCG/2ML IJ SOLN
25.0000 ug | INTRAMUSCULAR | Status: DC | PRN
  Administered 2024-02-16: 50 ug via INTRAVENOUS
  Administered 2024-02-16: 25 ug via INTRAVENOUS

## 2024-02-16 SURGICAL SUPPLY — 28 items
BLADE SURG 15 STRL LF DISP TIS (BLADE) ×1 IMPLANT
CORD BIP STRL DISP 12FT (MISCELLANEOUS) ×1 IMPLANT
DRSG TEGADERM 2-3/8X2-3/4 SM (GAUZE/BANDAGES/DRESSINGS) IMPLANT
DRSG TELFA 3X4 N-ADH STERILE (GAUZE/BANDAGES/DRESSINGS) IMPLANT
ELECTRODE REM PT RTRN 9FT ADLT (ELECTROSURGICAL) ×1 IMPLANT
EVACUATOR DRAINAGE 7X20 100CC (MISCELLANEOUS) IMPLANT
FORCEPS JEWEL BIP 4-3/4 STR (INSTRUMENTS) ×1 IMPLANT
GAUZE 4X4 16PLY ~~LOC~~+RFID DBL (SPONGE) ×1 IMPLANT
GLOVE BIO SURGEON STRL SZ7.5 (GLOVE) ×1 IMPLANT
GOWN STRL REUS W/ TWL LRG LVL3 (GOWN DISPOSABLE) ×2 IMPLANT
HOOK STAY BLUNT/RETRACTOR 5M (MISCELLANEOUS) IMPLANT
KIT TURNOVER KIT A (KITS) ×1 IMPLANT
LABEL OR SOLS (LABEL) IMPLANT
MANIFOLD NEPTUNE II (INSTRUMENTS) ×1 IMPLANT
NS IRRIG 500ML POUR BTL (IV SOLUTION) ×1 IMPLANT
PACK HEAD/NECK (MISCELLANEOUS) ×1 IMPLANT
PAD MAGNETIC INSTR ST 16X20 (MISCELLANEOUS) ×1 IMPLANT
PROBE NEUROSIGN BIPOL (MISCELLANEOUS) IMPLANT
SHEARS HARMONIC 9CM CVD (BLADE) ×1 IMPLANT
SPONGE KITTNER 5P (MISCELLANEOUS) ×1 IMPLANT
SUCTION TUBE FRAZIER 10FR DISP (SUCTIONS) ×1 IMPLANT
SUT PROLENE 3 0 FS 2 (SUTURE) ×1 IMPLANT
SUT PROLENE 5 0 PS 3 (SUTURE) IMPLANT
SUT SILK 3-0 18XBRD TIE 12 (SUTURE) ×1 IMPLANT
SUT VIC AB 4-0 RB1 18 (SUTURE) ×1 IMPLANT
TRAP FLUID SMOKE EVACUATOR (MISCELLANEOUS) ×1 IMPLANT
TUBING CONNECTING 10 (TUBING) IMPLANT
WATER STERILE IRR 500ML POUR (IV SOLUTION) ×1 IMPLANT

## 2024-02-16 NOTE — Anesthesia Postprocedure Evaluation (Signed)
 Anesthesia Post Note  Patient: Stephanie Warren  Procedure(s) Performed: EXCISION, MASS, NECK (Right)  Patient location during evaluation: PACU Anesthesia Type: General Level of consciousness: awake and alert Pain management: pain level controlled Vital Signs Assessment: post-procedure vital signs reviewed and stable Respiratory status: spontaneous breathing, nonlabored ventilation, respiratory function stable and patient connected to nasal cannula oxygen Cardiovascular status: blood pressure returned to baseline and stable Postop Assessment: no apparent nausea or vomiting Anesthetic complications: no  No notable events documented.   Last Vitals:  Vitals:   02/16/24 0935 02/16/24 0940  BP:    Pulse: 75 76  Resp: 18 15  Temp:    SpO2: 93% 95%    Last Pain:  Vitals:   02/16/24 0940  PainSc: Asleep                 Enrique Harvest

## 2024-02-16 NOTE — Op Note (Signed)
 02/16/2024  8:58 AM    Marne Sings  [id number]   Pre-Op Diagnosis:  RIGHT NECK CYSTIC MASS  Post-op Diagnosis: SAME  Procedure: Excision of Right deep neck cystic mass  Surgeon:  Marjean Sierras  Assistant: Lesly Raspberry  Anesthesia:  General endotracheal anesthesia  EBL:  less than 10 cc  Complications:  None  Findings: 3 x 1.5 firm mass posterior and deep to the right submandibular gland, clearly separate from the gland  Procedure: The patient was taken to the Operating Room and placed in the supine position.  After induction of general endotracheal anesthesia, the patient was turned 90 degrees and placed on a shoulder roll. The skin was injected along the proposed incision inferior to the right submandibular region with 1% lidocaine  with epinephrine, 1:100,000. The facial nerve monitor electrodes were placed in the usual fashion at the lower lip on the same side of the procedure. Proper functioning of the nerve monitor was assessed. The area was then prepped and draped in the usual sterile fashion.   A 15 blade was then used to incise the skin. The dissection was carried down to the subcutaneous tissues with the harmonic scalpel and through the platysma muscle. Dissection then proceeded deeper, dissecting down to the inferior margin of the submandibular gland, watching for the marginal mandibular nerve during dissection. The posterior and inferior aspect of the gland was dissected away from the digastric muscle using the harmonic scalpel, and the fascia over the gland dissected superiorly from the surface of the gland, again watching for the marginal nerve during dissection. A large facial vein was divided to facilitate dissection. The mass was not immediately seen, but after freeing up the posterior aspect of the submandibular gland, the mass was palpated posterior and deep to the gland, clearly quite separate from it. The mass was carefully dissected out, dividing fascial  attachments between the mass and the posterior belly of the digastric superiorly.  There was some scarring between the mass and the anterior aspect of the sternocleidomastoid muscle, likely from prior FNA, and the mass was dissected free of the sternocleidomastoid muscle with the harmonic scalpel.  More deeply the mass was easily dissected away from the underlying internal jugular vein dividing fascial attachments with the harmonic scalpel.  Care was taken to look for the spinal accessory nerve during this dissection but it was not encountered.  Once the mass was dissected free it was sent as a specimen.  The submandibular gland itself was palpated and was soft with no discrete mass.  No other immediate lymphadenopathy was palpable.  The wound bed was relatively dry, minor oozing addressed with the bipolar cautery.  Surgicel was placed in the wound bed with no need for a drain.    Deep closure was performed with 4-0 Vicryl suture, closing the platysma. The subcutaneous tissues were then closed with 4-0 Vicryl suture in an interrupted fashion. The skin was closed with 5-0 Prolene suture in a running locked stitch. Bacitracin ointment was applied to the wound.   The patient was then returned to the anesthesiologist for awakening, and was taken to the Recovery Room in stable condition.   Cultures:  None.   Disposition:   PACU then discharge home   Plan: Discharge home with . Follow-up in 1 week for suture removal.  Disposition:   PACU then discharge home  Plan: To floor for observation with potential discharge home with drain in place.    Marjean Sierras 02/16/2024 8:58 AM

## 2024-02-16 NOTE — Anesthesia Preprocedure Evaluation (Signed)
 Anesthesia Evaluation  Patient identified by MRN, date of birth, ID band Patient awake    Reviewed: Allergy & Precautions, H&P , NPO status , Patient's Chart, lab work & pertinent test results  Airway Mallampati: II  TM Distance: >3 FB Neck ROM: full    Dental no notable dental hx. (+) Dental Advidsory Given   Pulmonary neg pulmonary ROS, COPD,  COPD inhaler, Current Smoker   Pulmonary exam normal        Cardiovascular negative cardio ROS Normal cardiovascular exam     Neuro/Psych  PSYCHIATRIC DISORDERS Anxiety Depression Bipolar Disorder   negative neurological ROS  negative psych ROS   GI/Hepatic negative GI ROS, Neg liver ROS,,,  Endo/Other  negative endocrine ROS    Renal/GU      Musculoskeletal   Abdominal   Peds  Hematology negative hematology ROS (+)   Anesthesia Other Findings Past Medical History: No date: Abnormal Pap smear No date: Anxiety No date: Asthma No date: Bipolar disorder (HCC) No date: Depression 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 No date: Infection     Comment:  yeast No date: Lymphadenopathy of right cervical region No date: PCOS (polycystic ovarian syndrome)  Past Surgical History: No date: BARTHOLIN GLAND CYST EXCISION 02/07/2013: CESAREAN SECTION WITH BILATERAL TUBAL LIGATION; Bilateral     Comment:  Procedure: CESAREAN SECTION WITH BILATERAL TUBAL               LIGATION;  Surgeon: Ana Balling, MD;  Location: [facility];                Service: Obstetrics;  Laterality: Bilateral; No date: CYST EXCISION PERINEAL No date: DILATION AND CURETTAGE OF UTERUS     Comment:  x3 No date: LUNG SURGERY     Comment:  left lung puncture No date: RIB FRACTURE SURGERY     Comment:  left side / 10 ribs broken. Horse back riding accident.  BMI    Body Mass Index: 29.18 kg/m      Reproductive/Obstetrics negative OB ROS (+) Pregnancy                             Anesthesia  Physical Anesthesia Plan  ASA: 3  Anesthesia Plan: General ETT   Post-op Pain Management:    Induction: Intravenous  PONV Risk Score and Plan: 3 and Ondansetron , Dexamethasone and Midazolam  Airway Management Planned: Oral ETT  Additional Equipment:   Intra-op Plan:   Post-operative Plan: Extubation in OR  Informed Consent: I have reviewed the patients History and Physical, chart, labs and discussed the procedure including the risks, benefits and alternatives for the proposed anesthesia with the patient or authorized representative who has indicated his/her understanding and acceptance.     Dental Advisory Given  Plan Discussed with: Anesthesiologist, CRNA and Surgeon  Anesthesia Plan Comments: (Patient consented for risks of anesthesia including but not limited to:  - adverse reactions to medications - damage to eyes, teeth, lips or other oral mucosa - nerve damage due to positioning  - sore throat or hoarseness - Damage to heart, brain, nerves, lungs, other parts of body or loss of life  Patient voiced understanding and assent.)       Anesthesia Quick Evaluation

## 2024-02-16 NOTE — H&P (Signed)
 History and physical reviewed and will be scanned in later. No change in medical status reported by the patient or family, appears stable for surgery. All questions regarding the procedure answered, and patient (or family if a child) expressed understanding of the procedure. ? ?Stephanie Warren ?@TODAY @ ?

## 2024-02-16 NOTE — Transfer of Care (Signed)
 Immediate Anesthesia Transfer of Care Note  Patient: Stephanie Warren  Procedure(s) Performed: EXCISION, MASS, NECK (Right)  Patient Location: PACU  Anesthesia Type:General  Level of Consciousness: awake, alert , and oriented  Airway & Oxygen Therapy: Patient Spontanous Breathing and Patient connected to face mask oxygen  Post-op Assessment: Report given to RN, Post -op Vital signs reviewed and stable, Patient moving all extremities, and Patient able to stick tongue midline  Post vital signs: Reviewed and stable  Last Vitals:  Vitals Value Taken Time  BP 138/80 02/16/24 0906  Temp 36.3 C 02/16/24 0906  Pulse 80 02/16/24 0912  Resp 21 02/16/24 0912  SpO2 100 % 02/16/24 0912  Vitals shown include unfiled device data.  Last Pain:  Vitals:   02/16/24 0618  PainSc: 0-No pain         Complications: No notable events documented.

## 2024-02-16 NOTE — Anesthesia Procedure Notes (Signed)
 Procedure Name: Intubation Date/Time: 02/16/2024 7:44 AM  Performed by: Ezzard Holms, CRNAPre-anesthesia Checklist: Patient identified, Patient being monitored, Timeout performed, Emergency Drugs available and Suction available Patient Re-evaluated:Patient Re-evaluated prior to induction Oxygen Delivery Method: Circle system utilized Preoxygenation: Pre-oxygenation with 100% oxygen Induction Type: IV induction Ventilation: Mask ventilation without difficulty Laryngoscope Size: Mac, 3 and McGrath Grade View: Grade I Tube type: Oral Tube size: 7.0 mm Number of attempts: 2 Airway Equipment and Method: Stylet Placement Confirmation: ETT inserted through vocal cords under direct vision, positive ETCO2 and breath sounds checked- equal and bilateral Secured at: 21 cm Tube secured with: Tape Dental Injury: Teeth and Oropharynx as per pre-operative assessment

## 2024-02-17 ENCOUNTER — Encounter: Payer: Self-pay | Admitting: Otolaryngology

## 2024-02-17 LAB — SURGICAL PATHOLOGY

## 2024-02-18 ENCOUNTER — Other Ambulatory Visit: Payer: Self-pay | Admitting: Otolaryngology

## 2024-02-18 DIAGNOSIS — C760 Malignant neoplasm of head, face and neck: Secondary | ICD-10-CM

## 2024-03-06 ENCOUNTER — Ambulatory Visit
Admission: RE | Admit: 2024-03-06 | Discharge: 2024-03-06 | Disposition: A | Discharge status: Home / Self Care | Source: Ambulatory Visit | Attending: Otolaryngology | Admitting: Otolaryngology

## 2024-03-06 DIAGNOSIS — C760 Malignant neoplasm of head, face and neck: Secondary | ICD-10-CM | POA: Insufficient documentation

## 2024-03-06 DIAGNOSIS — I517 Cardiomegaly: Secondary | ICD-10-CM | POA: Insufficient documentation

## 2024-03-06 DIAGNOSIS — I2510 Atherosclerotic heart disease of native coronary artery without angina pectoris: Secondary | ICD-10-CM | POA: Diagnosis not present

## 2024-03-06 LAB — GLUCOSE, CAPILLARY: Glucose-Capillary: 92 mg/dL (ref 70–99)

## 2024-03-06 MED ORDER — FLUDEOXYGLUCOSE F - 18 (FDG) INJECTION
9.2200 | Freq: Once | INTRAVENOUS | Status: AC | PRN
  Administered 2024-03-06: 9.22 via INTRAVENOUS

## 2024-03-07 ENCOUNTER — Encounter: Payer: Self-pay | Admitting: Internal Medicine

## 2024-03-07 ENCOUNTER — Inpatient Hospital Stay

## 2024-03-07 ENCOUNTER — Inpatient Hospital Stay: Attending: Internal Medicine | Admitting: Internal Medicine

## 2024-03-07 VITALS — BP 147/98 | HR 82 | Temp 97.7°F | Resp 12 | Ht 64.0 in | Wt 163.9 lb

## 2024-03-07 DIAGNOSIS — F419 Anxiety disorder, unspecified: Secondary | ICD-10-CM | POA: Diagnosis not present

## 2024-03-07 DIAGNOSIS — F32A Depression, unspecified: Secondary | ICD-10-CM | POA: Diagnosis not present

## 2024-03-07 DIAGNOSIS — Z800 Family history of malignant neoplasm of digestive organs: Secondary | ICD-10-CM

## 2024-03-07 DIAGNOSIS — R110 Nausea: Secondary | ICD-10-CM

## 2024-03-07 DIAGNOSIS — C760 Malignant neoplasm of head, face and neck: Secondary | ICD-10-CM

## 2024-03-07 DIAGNOSIS — C099 Malignant neoplasm of tonsil, unspecified: Secondary | ICD-10-CM | POA: Diagnosis not present

## 2024-03-07 DIAGNOSIS — F17210 Nicotine dependence, cigarettes, uncomplicated: Secondary | ICD-10-CM

## 2024-03-07 DIAGNOSIS — Z801 Family history of malignant neoplasm of trachea, bronchus and lung: Secondary | ICD-10-CM

## 2024-03-07 DIAGNOSIS — C490 Malignant neoplasm of connective and soft tissue of head, face and neck: Secondary | ICD-10-CM

## 2024-03-07 NOTE — Progress Notes (Signed)
 PET yesterday.  C/o pain in head, neck, right ear and face since mass was taken out 02/16/24, Dr. [employer]. Having some trouble swallowing solids and liquids.   She is having some nausea, she thinks is associated with anxiety. She has been taking her sons dissolvable zofran  prn.

## 2024-03-07 NOTE — Progress Notes (Addendum)
 [facility] CONSULT NOTE  Patient Care Team: Center, [facility] as PCP - General (General Practice) Gwyn Leos, MD as Consulting Physician (Oncology)  CHIEF COMPLAINTS/PURPOSE OF CONSULTATION: cancer of neck  Oncology History Overview Note  IMPRESSION: Solitary oval 1.5 x 3 cm right neck soft tissue mass abutting the posterior to the right submandibular gland most resembles an abnormal lymph node at the junction of the 2a/3a stations. This has a rounded internal cystic or necrotic component. However, no primary pharyngeal or laryngeal tumor is identified. And otherwise negative CT appearance of the Neck soft tissues. This may be amenable to Ultrasound guided needle biopsy.     Electronically Signed   By: Marlise Simpers M.D.   On: 01/13/2024 11:43       MAY 22nd, 2025-  1. Soft tissue mass, simple excision, right deep neck mass :      - METASTATIC HIGH-GRADE CARCINOMA.      - SEE NOTE.       Diagnosis Note : Per [facility] recent imaging revealed a solitary oval 1.5 x 3 cm      right neck soft tissue mass abutting the posterior to the right submandibular      gland.      Sections demonstrate a high grade carcinoma involing a lymph node. The carcinoma      has squamous and clear cell / sebaceous differentiation. The lesional cells are      positive for p40. GATA3, GCDFP, androgen receptor, SOX10, and S100 are negative.      A mucicarmine stain is negative. Stain controls worked appropriately.      Based on the morphologic findings and pattern of staining the differential      diagnosis includes metastatic squamous cell carcinoma (clear cell type) and      metastatic carcinoma ex pleomorphic adenoma. Salivary duct carcinoma and      sebeceous adenocarcinoma are considered less likely given negativity for      androgen receptor. Correlation with radiographic findings and clinical history      is required.      Dr. [employer] was notified on 02/17/2024.  This case underwent intradepartmental      consultation and Dr. Talmadge Fail concurs with the interpretation.      There is sufficient material for ancillary molecular testing.    Primary cancer of tonsil (HCC)  03/14/2024 Initial Diagnosis   Primary cancer of tonsil (HCC)   03/14/2024 Cancer Staging   Staging form: Pharynx - HPV-Mediated Oropharynx, AJCC 8th Edition - Clinical: Stage I (cT0, cN1, cM0, p16: Unknown, HPV: Unknown) - Signed by Gwyn Leos, MD on 03/14/2024      HISTORY OF PRESENTING ILLNESS: Patient ambulating-independently.Alone.  Stephanie Warren 53 y.o.  female pleasant patient with   Noted to have tickle in the throat for last year. Also noted to have pain while solids/liquids.  Evaluated by ENT- Dr.Bennett s/p excisional Biopsy. Patient had PET yesterday.   C/o pain in head, neck, right ear and face. Taking hydrocodone  prn.  She is having some nausea, she thinks is associated with anxiety. She has been taking her sons dissolvable zofran  prn  APRIL 2025- CT neck: Solitary oval 1.5 x 3 cm right neck soft tissue mass abutting the posterior to the right submandibular gland most resembles an abnormal lymph node at the junction of the 2a/3a stations.However, no primary pharyngeal or laryngeal tumor is identified. And otherwise negative CT appearance of the Neck soft tissues. 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 The carcinoma  has squamous and clear cell / sebaceous differentiation. The lesional cells are  positive for p40. GATA3, GCDFP, androgen receptor, SOX10, and S100 are negative.    A mucicarmine stain is negative. Differential- Diagnosis includes metastatic squamous cell carcinoma (clear cell type) and  metastatic carcinoma ex pleomorphic adenoma. Salivary duct carcinoma and sebeceous adenocarcinoma are considered less likely given  negativity for androgen receptor. There is sufficient material for ancillary molecular testing.   # Based on CT scan no obvious evidence of any primary disease.  Recommend PET scan for further evaluation.  Will also discuss at tumor conference. Check for HPV DNA the blood; and also check p16 on the tumor.  Will also review at tumor conference.  Discussed with Dr. Lajoyce Pikes options include bilateral tonsillectomy in absence of obvious recommendation versus proceeding with definitive chemoradiation.  # Depression/ Anxiety- on zoloft- not well controlled -refer to Child psychotherapist.  Thank you Dr. [employer] for allowing me to participate in the care of your pleasant patient. Please do not hesitate to contact me with questions or concerns in the interim.  # DISPOSITION: # Anxiety- refer to Social worker # no labs today # follow up TBD- Dr.B  # I reviewed the blood work- with the patient in detail; also reviewed the imaging.   Discussed with pathology-pathologically- high-grade carcinoma involving the lymph node/metastatic-salivary gland or cutaneous adnexal. Discussed with Dr.Bennett.  PET scan reviewed-no primary source noted. Recommend order NGS testing,- please order [facility]. I would recommend referral to [facility]-for second opinion- re:  high-grade carcinoma involving the lymph node of neck.   I reached to pt with above plan.  Patient  unavailable.  Left a voicemail to call us  back-   GB   Review of Systems  Constitutional:  Negative for chills, diaphoresis, fever, malaise/fatigue and weight loss.  HENT:  Negative for nosebleeds and sore throat.   Eyes:  Negative for double vision.  Respiratory:  Negative for cough, hemoptysis, sputum production, shortness of breath and wheezing.   Cardiovascular:  Negative for chest pain, palpitations, orthopnea and leg swelling.  Gastrointestinal:  Negative for abdominal pain, blood in stool, constipation, diarrhea, heartburn, melena, nausea and vomiting.   Genitourinary:  Negative for dysuria, frequency and urgency.  Musculoskeletal:  Positive for neck pain. Negative for back pain and joint pain.  Skin: Negative.  Negative for itching and rash.  Neurological:  Negative for dizziness, tingling, focal weakness, weakness and headaches.  Endo/Heme/Allergies:  Does not bruise/bleed easily.  Psychiatric/Behavioral:  Negative for depression. The patient is not nervous/anxious and does not have insomnia.     MEDICAL HISTORY:  Past Medical History:  Diagnosis Date   Abnormal Pap smear    Anxiety    Asthma    Bipolar disorder (HCC)    Depression    Genital HSV    Infection    yeast   Lymphadenopathy of right cervical region    PCOS (polycystic ovarian syndrome)     SURGICAL HISTORY: Past Surgical History:  Procedure Laterality Date   BARTHOLIN GLAND CYST EXCISION     CESAREAN SECTION WITH BILATERAL TUBAL LIGATION Bilateral 02/07/2013   Procedure: CESAREAN SECTION WITH BILATERAL TUBAL LIGATION;  Surgeon: Ana Balling, MD;  Location: [facility];  Service: Obstetrics;  Laterality: Bilateral;   CYST EXCISION PERINEAL     DILATION AND CURETTAGE OF  UTERUS     x3   EXCISION MASS NECK Right 02/16/2024   Procedure: EXCISION, MASS, NECK;  Surgeon: Von Grumbling, MD;  Location: [facility];  Service: ENT;  Laterality: Right;   LUNG SURGERY     left lung puncture   RIB FRACTURE SURGERY     left side / 10 ribs broken. Horse back riding accident.    SOCIAL HISTORY: Social History   Socioeconomic History   Marital status: Single    Spouse name: Not on file   Number of children: Not on file   Years of education: Not on file   Highest education level: Not on file  Occupational History   Not on file  Tobacco Use   Smoking status: Every Day    Current packs/day: 0.25    Types: Cigarettes   Smokeless tobacco: Never  Vaping Use   Vaping status: Some Days  Substance and Sexual Activity   Alcohol use: Yes    Comment: Beer . Maybe 4 amonth   Drug  use: Not on file   Sexual activity: Yes  Other Topics Concern   Not on file  Social History Narrative   Not on file   Social Drivers of Health   Financial Resource Strain: Not on file  Food Insecurity: No Food Insecurity (03/07/2024)   Hunger Vital Sign    Worried About Running Out of Food in the Last Year: Never true    Ran Out of Food in the Last Year: Never true  Transportation Needs: No Transportation Needs (03/07/2024)   PRAPARE - Administrator, Civil Service (Medical): No    Lack of Transportation (Non-Medical): No  Physical Activity: Not on file  Stress: Not on file  Social Connections: Not on file  Intimate Partner Violence: Not At Risk (03/07/2024)   Humiliation, Afraid, Rape, and Kick questionnaire    Fear of Current or Ex-Partner: No    Emotionally Abused: No    Physically Abused: No    Sexually Abused: No    FAMILY HISTORY: Family History  Problem Relation Age of Onset   Drug abuse Mother    Hypertension Father    Hyperlipidemia Father    Cancer Father        liver, lung, bone   Asthma Father    Alcohol abuse Father    Hypertension Maternal Grandfather    Hyperlipidemia Maternal Grandfather    Cancer Maternal Grandfather        lung   Stroke Maternal Grandfather    Hypertension Paternal Grandfather    Hyperlipidemia Paternal Grandfather    Cancer Paternal Grandfather        lung and liver   Stroke Paternal Grandfather     ALLERGIES:  has no known allergies.  MEDICATIONS:  Current Outpatient Medications  Medication Sig Dispense Refill   BLACK COHOSH PO Take 2 tablets by mouth daily after lunch.     cetirizine (ZYRTEC) 10 MG tablet Take 10 mg by mouth daily as needed.     Cholecalciferol (VITAMIN D-3 PO) Take 1 tablet by mouth daily after lunch.     Cyanocobalamin (VITAMIN B-12 PO) Take 1 tablet by mouth daily after lunch.     diphenhydrAMINE  (BENADRYL ) 25 mg capsule Take 50 mg by mouth at bedtime.     gabapentin (NEURONTIN) 100 MG  capsule Take 200 mg by mouth 3 times/day as needed-between meals & bedtime.     HYDROcodone -acetaminophen  (NORCO/VICODIN) 5-325 MG tablet Take 1-2 tablets by mouth every 6 (  six) hours as needed for moderate pain (pain score 4-6). 40 tablet 0   omeprazole (PRILOSEC) 40 MG capsule Take 40 mg by mouth daily as needed (indigestion/heartburn).     sertraline (ZOLOFT) 50 MG tablet Take 50 mg by mouth daily after lunch.     Multiple Vitamins-Minerals (ZINC  PO) Take 1 tablet by mouth daily after lunch. (Patient not taking: Reported on 03/07/2024)     No current facility-administered medications for this visit.    PHYSICAL EXAMINATION:   Vitals:   03/07/24 1349  BP: (!) 147/98  Pulse: 82  Resp: 12  Temp: 97.7 F (36.5 C)  SpO2: 98%   Filed Weights   03/07/24 1349  Weight: 163 lb 14.4 oz (74.3 kg)   Approximately 2 cm lymph node noted in the submandibular region.   Physical Exam Vitals and nursing note reviewed.  HENT:     Head: Normocephalic and atraumatic.     Mouth/Throat:     Pharynx: Oropharynx is clear.   Eyes:     Extraocular Movements: Extraocular movements intact.     Pupils: Pupils are equal, round, and reactive to light.    Cardiovascular:     Rate and Rhythm: Normal rate and regular rhythm.  Pulmonary:     Comments: Decreased breath sounds bilaterally.  Abdominal:     Palpations: Abdomen is soft.   Musculoskeletal:        General: Normal range of motion.     Cervical back: Normal range of motion.   Skin:    General: Skin is warm.   Neurological:     General: No focal deficit present.     Mental Status: She is alert and oriented to person, place, and time.   Psychiatric:        Behavior: Behavior normal.        Judgment: Judgment normal.     LABORATORY DATA:  I have reviewed the data as listed Lab Results  Component Value Date   WBC 8.9 01/13/2024   HGB 13.5 01/13/2024   HCT 41.4 01/13/2024   MCV 97.2 01/13/2024   PLT 374 01/13/2024   Recent  Labs    01/13/24 1254  NA 139  K 4.1  CL 106  CO2 26  GLUCOSE 92  BUN 24*  CREATININE 0.71  CALCIUM  9.4  GFRNONAA >60    RADIOGRAPHIC STUDIES: I have personally reviewed the radiological images as listed and agreed with the findings in the report. NM PET Image Initial (PI) Skull Base To Thigh (F-18 FDG) Result Date: 03/08/2024 CLINICAL DATA:  Initial treatment strategy for primary neck neoplasm. EXAM: NUCLEAR MEDICINE PET SKULL BASE TO THIGH TECHNIQUE: 9.22 mCi F-18 FDG was injected intravenously. Full-ring PET imaging was performed from the skull base to thigh after the radiotracer. CT data was obtained and used for attenuation correction and anatomic localization. Fasting blood glucose: 92 mg/dl COMPARISON:  Neck CT 47/82/9562.  Biopsy 01/25/2024. FINDINGS: Mediastinal blood pool activity: SUV max 2.1 Liver activity: SUV max 3.0 NECK: On prior imaging there is a focal mass lesion measuring 3.0 x 1.3 x 1.5 cm immediately posterior to the right submandibular gland. This area of nodular tissue is again seen on image 21 measuring up to 21 mm. Only minimal uptake along the lateral aspect of this area with maximum SUV of 3.7. Please correlate with biopsy results. Otherwise there is no specific abnormal uptake along lymph nodes in the neck including submandibular, posterior triangle or internal jugular region. The right submandibular gland is  slightly larger and lower in density than the left, nonspecific. There is slight asymmetric uptake along the vallecular on the right side with only minimal thickening on CT scan in this location on image 22 with maximum SUV of 8.6. Please correlate for known history and if needed direct visualization to exclude subtle lesion. There is some physiologic strap muscle uptake in the low neck. Incidental CT findings: The parotid glands and submitted glands are preserved. Paranasal sinuses and mastoid air cells are clear. CHEST: No abnormal uptake above blood pool in the  axillary regions, hilum or mediastinum. No abnormal lung uptake. Incidental CT findings: Heart is slightly enlarged. Trace pericardial fluid versus thickening. Coronary artery calcifications are seen. Please correlate for other coronary risk factors. The thoracic aorta is normal course and caliber with slight calcified plaque. Normal caliber thoracic esophagus. Breathing motion. There is some linear opacity lung bases likely scar or atelectasis. No consolidation, pneumothorax or effusion. ABDOMEN/PELVIS: There is physiologic distribution radiotracer along the parenchymal organs, bowel and renal collecting systems. No abnormal nodal uptake identified this time. Incidental CT findings: Grossly on this noncontrast, attenuation correction CT liver, spleen, adrenal glands and pancreas are unremarkable. Gallbladder is present. The ureters have normal course and caliber extending down to the urinary bladder. Bladder is contracted. Uterus is present. Tubal ligation clips are noted. Diffuse colonic stool. The large bowel is normal course and caliber. Normal appendix extends inferior to the cecum in the right hemipelvis. Stomach and small bowel are nondilated. No free air or free fluid. Scattered vascular calcifications. SKELETON: No abnormal uptake along visualized osseous structures. There is some areas of muscular soft tissue physiologic uptake along the upper extremities, low neck and paraspinal regions. Incidental CT findings: Diffuse degenerative changes identified. Typically areas along the spine. There is reversal of the normal cervical lordosis which could be positional. IMPRESSION: Soft tissue lesion again seen adjacent to the right submandibular gland. This has some minimal uptake 3 please correlate with biopsy results. There is slight asymmetric uptake along the right side of the vallecula without clear abnormality on CT. This is nonspecific. Please correlate for any history and if needed direct visualization to  exclude subtle lesion in the setting of potential right pathologic neck lymph node. Otherwise no specific areas of abnormal radiotracer uptake at this time. Coronary artery calcifications are seen. Please correlate for other coronary risk factors. The heart is slightly enlarged. Electronically Signed   By: Adrianna Horde M.D.   On: 03/08/2024 14:42     Primary cancer of tonsil (HCC) APRIL 2025- CT neck: Solitary oval 1.5 x 3 cm right neck soft tissue mass abutting the posterior to the right submandibular gland most resembles an abnormal lymph node at the junction of the 2a/3a stations.However, no primary pharyngeal or laryngeal tumor is identified. And otherwise negative CT appearance of the Neck soft tissues. 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 The carcinoma  has squamous and clear cell / sebaceous differentiation. The lesional cells are  positive for p40. GATA3, GCDFP, androgen receptor, SOX10, and S100 are negative.    A mucicarmine stain is negative. Differential- Diagnosis includes metastatic squamous cell carcinoma (clear cell type) and  metastatic carcinoma ex pleomorphic adenoma. Salivary duct carcinoma and sebeceous adenocarcinoma are considered less likely given negativity for androgen receptor. There is sufficient material for ancillary molecular testing.   # Based on CT  scan no obvious evidence of any primary disease.  Recommend PET scan for further evaluation.  Will also discuss at tumor conference. Check for HPV DNA the blood; and also check p16 on the tumor.  Will also review at tumor conference.  Discussed with Dr. Lajoyce Pikes options include bilateral tonsillectomy in absence of obvious recommendation versus proceeding with definitive chemoradiation.  # Depression/ Anxiety- on zoloft- not well controlled -refer to Child psychotherapist.  Thank you Dr.  Celso [employer] for allowing me to participate in the care of your pleasant patient. Please do not hesitate to contact me with questions or concerns in the interim.  # DISPOSITION: # Anxiety- refer to Social worker # no labs today # follow up TBD- Dr.B  # I reviewed the blood work- with the patient in detail; also reviewed the imaging.   Above plan of care was discussed with patient/family in detail.  My contact information was given to the patient/family.     Gwyn Leos, MD 03/14/2024 9:57 PM

## 2024-03-07 NOTE — Assessment & Plan Note (Addendum)
 APRIL 2025- CT neck: Solitary oval 1.5 x 3 cm right neck soft tissue mass abutting the posterior to the right submandibular gland most resembles an abnormal lymph node at the junction of the 2a/3a stations.However, no primary pharyngeal or laryngeal tumor is identified. And otherwise negative CT appearance of the Neck soft tissues. 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 The carcinoma  has squamous and clear cell / sebaceous differentiation. The lesional cells are  positive for p40. GATA3, GCDFP, androgen receptor, SOX10, and S100 are negative.    A mucicarmine stain is negative. Differential- Diagnosis includes metastatic squamous cell carcinoma (clear cell type) and  metastatic carcinoma ex pleomorphic adenoma. Salivary duct carcinoma and sebeceous adenocarcinoma are considered less likely given negativity for androgen receptor. There is sufficient material for ancillary molecular testing.   # Based on CT scan no obvious evidence of any primary disease.  Recommend PET scan for further evaluation.  Will also discuss at tumor conference. Check for HPV DNA the blood; and also check p16 on the tumor.  Will also review at tumor conference.  Discussed with Dr. Lajoyce Pikes options include bilateral tonsillectomy in absence of obvious recommendation versus proceeding with definitive chemoradiation.  # Depression/ Anxiety- on zoloft- not well controlled -refer to Child psychotherapist.  Thank you Dr. [employer] for allowing me to participate in the care of your pleasant patient. Please do not hesitate to contact me with questions or concerns in the interim.  # DISPOSITION: # Anxiety- refer to Social worker # no labs today # follow up TBD- Dr.B  # I reviewed the blood work- with the patient in detail; also reviewed the imaging independently [as summarized above]; and with the patient  in detail.

## 2024-03-08 ENCOUNTER — Telehealth: Payer: Self-pay

## 2024-03-08 NOTE — Telephone Encounter (Signed)
 Clinical Social Work was referred by medical provider for assessment of psychosocial needs and anxiety.  CSW attempted to contact patient by phone.  Left voicemail with contact information and request for return call.

## 2024-03-09 ENCOUNTER — Inpatient Hospital Stay

## 2024-03-09 NOTE — Progress Notes (Signed)
 [facility] Clinical Social Work  Initial Assessment   Stephanie Warren is a 53 y.o. year old female contacted by phone. Clinical Social Work was referred by medical provider for assessment of psychosocial needs.   SDOH (Social Determinants of Health) assessments performed: Yes   SDOH Screenings   Food Insecurity: No Food Insecurity (03/07/2024)  Housing: High Risk (03/07/2024)  Transportation Needs: No Transportation Needs (03/07/2024)  Utilities: Not At Risk (03/07/2024)  Depression (PHQ2-9): Low Risk  (03/07/2024)  Tobacco Use: High Risk (03/07/2024)     Distress Screen completed: No    03/07/2024    2:03 PM  ONCBCN DISTRESS SCREENING  Screening Type Initial Screening  How much distress have you been experiencing in the past week? (0-10) 8  Practical concerns type Housing/Utilities  Emotional concerns type Worry or anxiety  Physical Concerns Type  Pain;Sleep      Family/Social Information:  Housing Arrangement: patient lives with her 87 y/o son. Family members/support persons in your life? Family.  Patient has a brother that she is close to.  Her mother is unavailable and her father is deceased. Transportation concerns: no  Employment: Unemployed  Income source: Supported by [employer] and [employer] Financial concerns: No Type of concern: None Food access concerns: no Religious or spiritual practice: No Advanced directives: No Services Currently in place:  Medicaid  Coping/ Adjustment to diagnosis: Patient understands treatment plan and what happens next? yes Concerns about diagnosis and/or treatment: How I will care for other members of my family Patient reported stressors: Children, Anxiety/ nervousness, and Adjusting to my illness Hopes and/or priorities: Family Patient enjoys time with family/ friends Current coping skills/ strengths: Capable of independent living , Manufacturing systems engineer , General fund of knowledge , and Supportive family/friends     SUMMARY: Current SDOH  Barriers:  Financial constraints related to unemployment.  Clinical Social Work Clinical Goal(s):  Patient will work with SW to address concerns related to adjusting to illness.  Interventions: Discussed common feeling and emotions when being diagnosed with cancer, and the importance of support during treatment Informed patient of the support team roles and support services at [facility] Provided CSW contact information and encouraged patient to call with any questions or concerns Provided patient with information about CSW role while patient is receiving treatment.  Plan is for CSW to contact patient after she is presented at the tumor board next week to determine what her treatment plan is.  Patient does not currently have a therapist.  Discussed [facility] for future use.   Follow Up Plan: CSW will follow-up with patient by phone  Patient verbalizes understanding of plan: Yes    Kennth Peal, LCSW Clinical Social Worker [facility]  Patient is participating in a Managed Medicaid Plan:  Yes

## 2024-03-14 DIAGNOSIS — C099 Malignant neoplasm of tonsil, unspecified: Secondary | ICD-10-CM | POA: Insufficient documentation

## 2024-03-14 DIAGNOSIS — C760 Malignant neoplasm of head, face and neck: Secondary | ICD-10-CM | POA: Insufficient documentation

## 2024-03-14 NOTE — Assessment & Plan Note (Deleted)
 APRIL 2025- CT neck: Solitary oval 1.5 x 3 cm right neck soft tissue mass abutting the posterior to the right submandibular gland most resembles an abnormal lymph node at the junction of the 2a/3a stations.However, no primary pharyngeal or laryngeal tumor is identified. And otherwise negative CT appearance of the Neck soft tissues. 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 The carcinoma  has squamous and clear cell / sebaceous differentiation. The lesional cells are  positive for p40. GATA3, GCDFP, androgen receptor, SOX10, and S100 are negative.    A mucicarmine stain is negative. Differential- Diagnosis includes metastatic squamous cell carcinoma (clear cell type) and  metastatic carcinoma ex pleomorphic adenoma. Salivary duct carcinoma and sebeceous adenocarcinoma are considered less likely given negativity for androgen receptor. There is sufficient material for ancillary molecular testing.   # Based on CT scan no obvious evidence of any primary disease.  Recommend PET scan for further evaluation.  Will also discuss at tumor conference. Check for HPV DNA the blood; and also check p16 on the tumor.  Will also review at tumor conference.  Discussed with Dr. Lajoyce Pikes options include bilateral tonsillectomy in absence of obvious recommendation versus proceeding with definitive chemoradiation.  # Depression/ Anxiety- on zoloft- not well controlled -refer to Child psychotherapist.  Thank you Dr. [employer] for allowing me to participate in the care of your pleasant patient. Please do not hesitate to contact me with questions or concerns in the interim.  # DISPOSITION: # Anxiety- refer to Social worker # no labs today # follow up TBD- Dr.B  # I reviewed the blood work- with the patient in detail; also reviewed the imaging.

## 2024-03-15 ENCOUNTER — Telehealth: Payer: Self-pay | Admitting: Internal Medicine

## 2024-03-15 ENCOUNTER — Other Ambulatory Visit: Payer: Self-pay | Admitting: *Deleted

## 2024-03-15 ENCOUNTER — Telehealth: Payer: Self-pay | Admitting: *Deleted

## 2024-03-15 DIAGNOSIS — C760 Malignant neoplasm of head, face and neck: Secondary | ICD-10-CM

## 2024-03-15 NOTE — Telephone Encounter (Signed)
 Discussed with pathology-pathologically- high-grade carcinoma involving the lymph node/metastatic-salivary gland or cutaneous adnexal. Discussed with Dr.Bennett.  PET scan reviewed-no primary source noted.  Recommend order NGS testing,- please order [facility].  I would recommend referral to [facility]-for second opinion- re:  high-grade carcinoma involving the lymph node of neck.   I reached to pt with above plan.  Patient  unavailable.  Left a voicemail to call us  back-   Please have pt follow up in 3 weeks- MD: labs-cbc/cmp; thyroid profile-   GB

## 2024-03-15 NOTE — Assessment & Plan Note (Signed)
 APRIL 2025- CT neck: Solitary oval 1.5 x 3 cm right neck soft tissue mass abutting the posterior to the right submandibular gland most resembles an abnormal lymph node at the junction of the 2a/3a stations.However, no primary pharyngeal or laryngeal tumor is identified. And otherwise negative CT appearance of the Neck soft tissues. 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 The carcinoma  has squamous and clear cell / sebaceous differentiation. The lesional cells are  positive for p40. GATA3, GCDFP, androgen receptor, SOX10, and S100 are negative.    A mucicarmine stain is negative. Differential- Diagnosis includes metastatic squamous cell carcinoma (clear cell type) and  metastatic carcinoma ex pleomorphic adenoma. Salivary duct carcinoma and sebeceous adenocarcinoma are considered less likely given negativity for androgen receptor. There is sufficient material for ancillary molecular testing.   # Based on CT scan no obvious evidence of any primary disease.  Recommend PET scan for further evaluation.  Will also discuss at tumor conference. Check for HPV DNA the blood; and also check p16 on the tumor.  Will also review at tumor conference.  Discussed with Dr. Lajoyce Pikes options include bilateral tonsillectomy in absence of obvious recommendation versus proceeding with definitive chemoradiation.  # Depression/ Anxiety- on zoloft- not well controlled -refer to Child psychotherapist.  Thank you Dr. [employer] for allowing me to participate in the care of your pleasant patient. Please do not hesitate to contact me with questions or concerns in the interim.  # DISPOSITION: # Anxiety- refer to Social worker # no labs today # follow up TBD- Dr.B  # I reviewed the blood work- with the patient in detail; also reviewed the imaging.   Addendum: 03/15/2024-Discussed with  pathology-pathologically- high-grade carcinoma involving the lymph node/metastatic-salivary gland or cutaneous adnexal.not suggestive of primary oropharyngeal-laryngeal. Discussed with Dr.Bennett.  PET scan reviewed-no primary source noted. Recommend order NGS testing,- please order [facility]. I would recommend referral to [facility]-for second opinion- re:  high-grade carcinoma involving the lymph node of neck.   I reached to pt with above plan.  Patient  unavailable.  Left a voicemail to call us  back-   GB

## 2024-03-15 NOTE — Telephone Encounter (Signed)
 [facility] ordered for NGS testing on specimen # [id number]. Referral faxed to [facility] for a second opinion.

## 2024-03-16 ENCOUNTER — Encounter

## 2024-03-16 ENCOUNTER — Telehealth: Payer: Self-pay | Admitting: *Deleted

## 2024-03-16 NOTE — Telephone Encounter (Signed)
 Patient has called said that he cannot tried to call him her today and go over the scan results but she did not hear it and she would like him to call her back [phone].  She told me that she has to have 2 days before she can get appointments because she has to call in and ask for the date that she needs to be done and she does not drive at all.

## 2024-03-17 DIAGNOSIS — R590 Localized enlarged lymph nodes: Secondary | ICD-10-CM | POA: Diagnosis not present

## 2024-03-24 DIAGNOSIS — R21 Rash and other nonspecific skin eruption: Secondary | ICD-10-CM | POA: Diagnosis not present

## 2024-03-24 DIAGNOSIS — Z87891 Personal history of nicotine dependence: Secondary | ICD-10-CM | POA: Diagnosis not present

## 2024-03-24 DIAGNOSIS — C799 Secondary malignant neoplasm of unspecified site: Secondary | ICD-10-CM | POA: Diagnosis not present

## 2024-03-24 DIAGNOSIS — K219 Gastro-esophageal reflux disease without esophagitis: Secondary | ICD-10-CM | POA: Diagnosis not present

## 2024-03-24 DIAGNOSIS — C760 Malignant neoplasm of head, face and neck: Secondary | ICD-10-CM | POA: Diagnosis not present

## 2024-03-24 DIAGNOSIS — I2584 Coronary atherosclerosis due to calcified coronary lesion: Secondary | ICD-10-CM | POA: Diagnosis not present

## 2024-03-27 ENCOUNTER — Encounter: Payer: Self-pay | Admitting: Internal Medicine

## 2024-03-28 DIAGNOSIS — L930 Discoid lupus erythematosus: Secondary | ICD-10-CM | POA: Diagnosis not present

## 2024-03-28 DIAGNOSIS — R21 Rash and other nonspecific skin eruption: Secondary | ICD-10-CM | POA: Diagnosis not present

## 2024-03-30 ENCOUNTER — Encounter: Payer: Self-pay | Admitting: Internal Medicine

## 2024-03-30 DIAGNOSIS — D485 Neoplasm of uncertain behavior of skin: Secondary | ICD-10-CM | POA: Diagnosis not present

## 2024-04-03 ENCOUNTER — Telehealth: Payer: Self-pay | Admitting: *Deleted

## 2024-04-03 DIAGNOSIS — R221 Localized swelling, mass and lump, neck: Secondary | ICD-10-CM | POA: Diagnosis not present

## 2024-04-03 NOTE — Telephone Encounter (Signed)
 Returned call to patient who states she went to ENT @ [facility] but still does not understand   what is going on.  Pt is requesting phone call from Dr Rennie to go over PET scan results with her.

## 2024-04-04 ENCOUNTER — Telehealth: Payer: Self-pay | Admitting: Internal Medicine

## 2024-04-04 NOTE — Telephone Encounter (Signed)
 I returned the patient's phone call-left voicemail to call us  back.  GB

## 2024-04-05 ENCOUNTER — Encounter: Payer: Self-pay | Admitting: Internal Medicine

## 2024-04-07 ENCOUNTER — Telehealth: Payer: Self-pay | Admitting: *Deleted

## 2024-04-07 NOTE — Telephone Encounter (Signed)
 Patient called today and wanted to get the results of the PET scan.  She did say that she had somebody else to give her the results but she wants to hear from Dr. Rennie or his nurse and she said call when you can

## 2024-04-07 NOTE — Telephone Encounter (Signed)
 I know that the patient said that whenever they can get the chance to go over the results of the PET scan but I did try to call and it rang about 10 times and nobody picked up and did not have a place to send a message .  But I did put in for Dr. Rennie and Rosaline to get up with him on Monday

## 2024-04-11 DIAGNOSIS — Z79899 Other long term (current) drug therapy: Secondary | ICD-10-CM | POA: Diagnosis not present

## 2024-04-11 DIAGNOSIS — L932 Other local lupus erythematosus: Secondary | ICD-10-CM | POA: Diagnosis not present

## 2024-04-14 DIAGNOSIS — R21 Rash and other nonspecific skin eruption: Secondary | ICD-10-CM | POA: Diagnosis not present

## 2024-04-14 DIAGNOSIS — L932 Other local lupus erythematosus: Secondary | ICD-10-CM | POA: Diagnosis not present

## 2024-05-25 ENCOUNTER — Emergency Department

## 2024-05-25 ENCOUNTER — Encounter: Payer: Self-pay | Admitting: Emergency Medicine

## 2024-05-25 ENCOUNTER — Other Ambulatory Visit: Payer: Self-pay

## 2024-05-25 ENCOUNTER — Emergency Department
Admission: EM | Admit: 2024-05-25 | Discharge: 2024-05-25 | Disposition: A | Discharge status: Home / Self Care | Attending: Emergency Medicine | Admitting: Emergency Medicine

## 2024-05-25 DIAGNOSIS — Z85818 Personal history of malignant neoplasm of other sites of lip, oral cavity, and pharynx: Secondary | ICD-10-CM | POA: Insufficient documentation

## 2024-05-25 DIAGNOSIS — J45909 Unspecified asthma, uncomplicated: Secondary | ICD-10-CM | POA: Diagnosis not present

## 2024-05-25 DIAGNOSIS — R059 Cough, unspecified: Secondary | ICD-10-CM | POA: Diagnosis not present

## 2024-05-25 DIAGNOSIS — J209 Acute bronchitis, unspecified: Secondary | ICD-10-CM | POA: Diagnosis not present

## 2024-05-25 DIAGNOSIS — R0602 Shortness of breath: Secondary | ICD-10-CM | POA: Diagnosis present

## 2024-05-25 LAB — BASIC METABOLIC PANEL WITH GFR
Anion gap: 12 (ref 5–15)
BUN: 16 mg/dL (ref 6–20)
CO2: 26 mmol/L (ref 22–32)
Calcium: 9.7 mg/dL (ref 8.9–10.3)
Chloride: 102 mmol/L (ref 98–111)
Creatinine, Ser: 0.74 mg/dL (ref 0.44–1.00)
GFR, Estimated: >60 mL/min (ref >60)
Glucose, Bld: 98 mg/dL (ref 70–99)
Potassium: 3.9 mmol/L (ref 3.5–5.1)
Sodium: 140 mmol/L (ref 135–145)

## 2024-05-25 LAB — TROPONIN I (HIGH SENSITIVITY)
Troponin I (High Sensitivity): 3 ng/L (ref <18)
Troponin I (High Sensitivity): 3 ng/L (ref <18)

## 2024-05-25 LAB — CBC
HCT: 40.6 % (ref 36.0–46.0)
Hemoglobin: 13.4 g/dL (ref 12.0–15.0)
MCH: 32.8 pg (ref 26.0–34.0)
MCHC: 33 g/dL (ref 30.0–36.0)
MCV: 99.3 fL (ref 80.0–100.0)
Platelets: 273 K/uL (ref 150–400)
RBC: 4.09 MIL/uL (ref 3.87–5.11)
RDW: 13 % (ref 11.5–15.5)
WBC: 8.1 K/uL (ref 4.0–10.5)
nRBC: 0 % (ref 0.0–0.2)

## 2024-05-25 LAB — RESP PANEL BY RT-PCR (RSV, FLU A&B, COVID)  RVPGX2
Influenza A by PCR: NEGATIVE
Influenza B by PCR: NEGATIVE
Resp Syncytial Virus by PCR: NEGATIVE
SARS Coronavirus 2 by RT PCR: NEGATIVE

## 2024-05-25 MED ORDER — KETOROLAC TROMETHAMINE 15 MG/ML IJ SOLN
15.0000 mg | Freq: Once | INTRAMUSCULAR | Status: AC
  Administered 2024-05-25: 15 mg via INTRAVENOUS
  Filled 2024-05-25: qty 1

## 2024-05-25 MED ORDER — IPRATROPIUM-ALBUTEROL 0.5-2.5 (3) MG/3ML IN SOLN
3.0000 mL | Freq: Once | RESPIRATORY_TRACT | Status: AC
  Administered 2024-05-25: 3 mL via RESPIRATORY_TRACT
  Filled 2024-05-25: qty 3

## 2024-05-25 MED ORDER — IOHEXOL 350 MG/ML SOLN
75.0000 mL | Freq: Once | INTRAVENOUS | Status: AC | PRN
  Administered 2024-05-25: 55 mL via INTRAVENOUS

## 2024-05-25 MED ORDER — METHYLPREDNISOLONE SODIUM SUCC 125 MG IJ SOLR
125.0000 mg | INTRAMUSCULAR | Status: AC
  Administered 2024-05-25: 125 mg via INTRAVENOUS
  Filled 2024-05-25: qty 2

## 2024-05-25 MED ORDER — LIDOCAINE VISCOUS HCL 2 % MT SOLN: 15.0000 mL | OROMUCOSAL | 0 refills | Status: AC | PRN

## 2024-05-25 MED ORDER — DOXYCYCLINE HYCLATE 100 MG PO CAPS: 100.0000 mg | ORAL_CAPSULE | Freq: Two times a day (BID) | ORAL | 0 refills | Status: AC

## 2024-05-25 MED ORDER — FLUCONAZOLE 200 MG PO TABS: 200.0000 mg | ORAL_TABLET | Freq: Every day | ORAL | 0 refills | Status: AC

## 2024-05-25 NOTE — ED Triage Notes (Signed)
 Patient to ED via POV for centralized CP. Started approx 1.5 hrs ago when she started coughing up blood- black in color per patient. Pt reports she has throat cancer and suppose to start chemo next week.

## 2024-05-25 NOTE — ED Provider Notes (Signed)
 [facility] Provider Note    Event Date/Time   First MD Initiated Contact with Patient 05/25/24 1505     (approximate)   History   Chief Complaint: Chest Pain   HPI  Stephanie Warren is a 53 y.o. female with history of bipolar disorder, throat cancer status post cervical lymph node dissection who comes ED complaining of pleuritic chest pain, shortness of breath, productive cough with hemoptysis, gradually worsening for the past 5 days.  Denies fever.  No syncope.        Past Medical History:  Diagnosis Date   Abnormal Pap smear    Anxiety    Asthma    Bipolar disorder (HCC)    Depression    Genital HSV    Infection    yeast   Lymphadenopathy of right cervical region    PCOS (polycystic ovarian syndrome)     Current Outpatient Rx   Order #: [id number] Class: Normal   [START ON 06/01/2024] Order #: [id number] Class: Normal   Order #: [id number] Class: Historical Med   Order #: [id number] Class: Historical Med   Order #: [id number] Class: Historical Med   Order #: [id number] Class: Historical Med   Order #: [id number] Class: Historical Med   Order #: [id number] Class: Historical Med   Order #: [id number] Class: Print   Order #: [id number] Class: Historical Med   Order #: [id number] Class: Historical Med   Order #: [id number] Class: Historical Med    Past Surgical History:  Procedure Laterality Date   BARTHOLIN GLAND CYST EXCISION     CESAREAN SECTION WITH BILATERAL TUBAL LIGATION Bilateral 02/07/2013   Procedure: CESAREAN SECTION WITH BILATERAL TUBAL LIGATION;  Surgeon: Harland JAYSON Birkenhead, MD;  Location: [facility];  Service: Obstetrics;  Laterality: Bilateral;   CYST EXCISION PERINEAL     DILATION AND CURETTAGE OF UTERUS     x3   EXCISION MASS NECK Right 02/16/2024   Procedure: EXCISION, MASS, NECK;  Surgeon: Blair Mt, MD;  Location: [facility] ORS;  Service: ENT;  Laterality: Right;   LUNG SURGERY     left lung puncture   RIB FRACTURE SURGERY     left side / 10 ribs  broken. Horse back riding accident.    Physical Exam   Triage Vital Signs: ED Triage Vitals  Encounter Vitals Group     BP 05/25/24 1314 (!) 150/109     Girls Systolic BP Percentile --      Girls Diastolic BP Percentile --      Boys Systolic BP Percentile --      Boys Diastolic BP Percentile --      Pulse Rate 05/25/24 1314 (!) 106     Resp 05/25/24 1314 18     Temp 05/25/24 1314 98.5 F (36.9 C)     Temp Source 05/25/24 1314 Oral     SpO2 05/25/24 1314 99 %     Weight 05/25/24 1312 166 lb (75.3 kg)     Height 05/25/24 1312 5' 5 (1.651 m)     Head Circumference --      Peak Flow --      Pain Score 05/25/24 1312 10     Pain Loc --      Pain Education --      Exclude from Growth Chart --     Most recent vital signs: Vitals:   05/25/24 1730 05/25/24 1900  BP: (!) 127/94 133/88  Pulse: 92 81  Resp: 14 (!) 21  Temp:    SpO2: 95% 96%    General:  Awake, no distress.  CV:  Good peripheral perfusion.  Regular rate rhythm Resp:  Normal effort.  Clear lungs Abd:  No distention.  Soft nontender Other:  No lower extremity edema   ED Results / Procedures / Treatments   Labs (all labs ordered are listed, but only abnormal results are displayed) Labs Reviewed  RESP PANEL BY RT-PCR (RSV, FLU A&B, COVID)  [id number]  BASIC METABOLIC PANEL WITH GFR  CBC  TROPONIN I (HIGH SENSITIVITY)  TROPONIN I (HIGH SENSITIVITY)     EKG Sinus tachycardia rate 107.  Normal axis and intervals.  Poor R wave progression.  No acute ischemic changes.   RADIOLOGY Chest x-ray interpreted by me, appears normal.  Radiology report reviewed CT chest negative for PE or other acute findings   PROCEDURES:  Procedures   MEDICATIONS ORDERED IN ED: Medications  ketorolac  (TORADOL ) 15 MG/ML injection 15 mg (15 mg Intravenous Given 05/25/24 1653)  methylPREDNISolone  sodium succinate (SOLU-MEDROL ) 125 mg/2 mL injection 125 mg (125 mg Intravenous Given 05/25/24 1652)  ipratropium-albuterol  (DUONEB)  0.5-2.5 (3) MG/3ML nebulizer solution 3 mL (3 mLs Nebulization Given 05/25/24 1652)  iohexol  (OMNIPAQUE ) 350 MG/ML injection 75 mL (55 mLs Intravenous Contrast Given 05/25/24 1742)     IMPRESSION / MDM / ASSESSMENT AND PLAN / ED COURSE  I reviewed the triage vital signs and the nursing notes.  DDx: Acute bronchitis, pneumonia, pneumothorax, pulmonary embolism, dehydration  Patient's presentation is most consistent with acute presentation with potential threat to life or bodily function.  Patient presents with pleuritic chest pain, shortness of breath, has risk factors for VTE.  Initial exam and chest x-ray unrevealing.  Labs unremarkable.  CT scan obtained which is negative for PE.  Presentation consistent with acute bronchitis, will treat with doxycycline , guaifenesin , primary care follow-up       FINAL CLINICAL IMPRESSION(S) / ED DIAGNOSES   Final diagnoses:  Acute bronchitis, unspecified organism     Rx / DC Orders   ED Discharge Orders          Ordered    fluconazole  (DIFLUCAN ) 200 MG tablet  Daily        05/25/24 1905    doxycycline  (VIBRAMYCIN ) 100 MG capsule  2 times daily        05/25/24 1905             Note:  This document was prepared using Dragon voice recognition software and may include unintentional dictation errors.   Viviann Pastor, MD 05/25/24 [id number]

## 2024-07-25 ENCOUNTER — Emergency Department

## 2024-07-25 ENCOUNTER — Emergency Department
Admission: EM | Admit: 2024-07-25 | Discharge: 2024-07-25 | Disposition: A | Discharge status: Home / Self Care | Attending: Emergency Medicine | Admitting: Emergency Medicine

## 2024-07-25 DIAGNOSIS — B349 Viral infection, unspecified: Secondary | ICD-10-CM | POA: Insufficient documentation

## 2024-07-25 DIAGNOSIS — K529 Noninfective gastroenteritis and colitis, unspecified: Secondary | ICD-10-CM | POA: Insufficient documentation

## 2024-07-25 DIAGNOSIS — R059 Cough, unspecified: Secondary | ICD-10-CM | POA: Diagnosis not present

## 2024-07-25 DIAGNOSIS — E876 Hypokalemia: Secondary | ICD-10-CM | POA: Diagnosis not present

## 2024-07-25 DIAGNOSIS — J45909 Unspecified asthma, uncomplicated: Secondary | ICD-10-CM | POA: Diagnosis not present

## 2024-07-25 DIAGNOSIS — R197 Diarrhea, unspecified: Secondary | ICD-10-CM | POA: Diagnosis present

## 2024-07-25 DIAGNOSIS — Z85828 Personal history of other malignant neoplasm of skin: Secondary | ICD-10-CM | POA: Diagnosis not present

## 2024-07-25 LAB — URINALYSIS, W/ REFLEX TO CULTURE (INFECTION SUSPECTED)
Bacteria, UA: NONE SEEN
Bilirubin Urine: NEGATIVE
Glucose, UA: NEGATIVE mg/dL
Hgb urine dipstick: NEGATIVE
Ketones, ur: NEGATIVE mg/dL
Leukocytes,Ua: NEGATIVE
Nitrite: NEGATIVE
Protein, ur: NEGATIVE mg/dL
Specific Gravity, Urine: >1.046 — ABNORMAL HIGH (ref 1.005–1.030)
pH: 5 (ref 5.0–8.0)

## 2024-07-25 LAB — CBC WITH DIFFERENTIAL/PLATELET
Abs Immature Granulocytes: 0 K/uL (ref 0.00–0.07)
Basophils Absolute: 0 K/uL (ref 0.0–0.1)
Basophils Relative: 1 %
Eosinophils Absolute: 0.1 K/uL (ref 0.0–0.5)
Eosinophils Relative: 2 %
HCT: 31.1 % — ABNORMAL LOW (ref 36.0–46.0)
Hemoglobin: 10.9 g/dL — ABNORMAL LOW (ref 12.0–15.0)
Immature Granulocytes: 0 %
Lymphocytes Relative: 37 %
Lymphs Abs: 1.6 K/uL (ref 0.7–4.0)
MCH: 32.9 pg (ref 26.0–34.0)
MCHC: 35 g/dL (ref 30.0–36.0)
MCV: 94 fL (ref 80.0–100.0)
Monocytes Absolute: 0.3 K/uL (ref 0.1–1.0)
Monocytes Relative: 8 %
Neutro Abs: 2.2 K/uL (ref 1.7–7.7)
Neutrophils Relative %: 52 %
Platelets: 253 K/uL (ref 150–400)
RBC: 3.31 MIL/uL — ABNORMAL LOW (ref 3.87–5.11)
RDW: 12.1 % (ref 11.5–15.5)
WBC: 4.2 K/uL (ref 4.0–10.5)
nRBC: 0 % (ref 0.0–0.2)

## 2024-07-25 LAB — RESP PANEL BY RT-PCR (RSV, FLU A&B, COVID)  RVPGX2
Influenza A by PCR: NEGATIVE
Influenza B by PCR: NEGATIVE
Resp Syncytial Virus by PCR: NEGATIVE
SARS Coronavirus 2 by RT PCR: NEGATIVE

## 2024-07-25 LAB — COMPREHENSIVE METABOLIC PANEL WITH GFR
ALT: 20 U/L (ref 0–44)
AST: 30 U/L (ref 15–41)
Albumin: 4.2 g/dL (ref 3.5–5.0)
Alkaline Phosphatase: 42 U/L (ref 38–126)
Anion gap: 12 (ref 5–15)
BUN: 12 mg/dL (ref 6–20)
CO2: 25 mmol/L (ref 22–32)
Calcium: 8.6 mg/dL — ABNORMAL LOW (ref 8.9–10.3)
Chloride: 100 mmol/L (ref 98–111)
Creatinine, Ser: 0.85 mg/dL (ref 0.44–1.00)
GFR, Estimated: >60 mL/min (ref >60)
Glucose, Bld: 110 mg/dL — ABNORMAL HIGH (ref 70–99)
Potassium: 3.4 mmol/L — ABNORMAL LOW (ref 3.5–5.1)
Sodium: 137 mmol/L (ref 135–145)
Total Bilirubin: 0.9 mg/dL (ref 0.0–1.2)
Total Protein: 7.9 g/dL (ref 6.5–8.1)

## 2024-07-25 LAB — LACTIC ACID, PLASMA: Lactic Acid, Venous: 1.3 mmol/L (ref 0.5–1.9)

## 2024-07-25 LAB — TROPONIN I (HIGH SENSITIVITY): Troponin I (High Sensitivity): 3 ng/L (ref <18)

## 2024-07-25 MED ORDER — IOHEXOL 350 MG/ML SOLN
75.0000 mL | Freq: Once | INTRAVENOUS | Status: AC | PRN
  Administered 2024-07-25: 75 mL via INTRAVENOUS

## 2024-07-25 MED ORDER — ONDANSETRON 4 MG PO TBDP: 4.0000 mg | ORAL_TABLET | Freq: Three times a day (TID) | ORAL | 0 refills | Status: AC | PRN

## 2024-07-25 MED ORDER — ALBUTEROL SULFATE HFA 108 (90 BASE) MCG/ACT IN AERS: 2.0000 | INHALATION_SPRAY | Freq: Four times a day (QID) | RESPIRATORY_TRACT | 0 refills | Status: AC | PRN

## 2024-07-25 MED ORDER — GUAIFENESIN ER 600 MG PO TB12: 600.0000 mg | ORAL_TABLET | Freq: Two times a day (BID) | ORAL | 0 refills | Status: AC

## 2024-07-25 MED ORDER — POTASSIUM CHLORIDE CRYS ER 20 MEQ PO TBCR
40.0000 meq | EXTENDED_RELEASE_TABLET | Freq: Once | ORAL | Status: AC
Start: 2024-07-25 — End: 2024-07-25
  Administered 2024-07-25: 40 meq via ORAL

## 2024-07-25 MED ORDER — ONDANSETRON HCL 4 MG/2ML IJ SOLN
4.0000 mg | Freq: Once | INTRAMUSCULAR | Status: AC
  Administered 2024-07-25: 4 mg via INTRAVENOUS
  Filled 2024-07-25: qty 2

## 2024-07-25 MED ORDER — LACTATED RINGERS IV BOLUS
1000.0000 mL | Freq: Once | INTRAVENOUS | Status: AC
  Administered 2024-07-25: 1000 mL via INTRAVENOUS

## 2024-07-25 NOTE — ED Provider Notes (Signed)
 [facility] Provider Note    Event Date/Time   First MD Initiated Contact with Patient 07/25/24 1522     (approximate)   History   Chief Complaint Diarrhea   HPI  Stephanie Warren is a 53 y.o. female with past medical history of squamous cell carcinoma of the supraglottis, asthma, PCOS, and bipolar disorder who presents to the ED complaining of diarrhea.  Patient reports that she has been feeling ill for the past 2 weeks with frequent episodes of watery diarrhea as well as nausea and vomiting.  She has had some discomfort into her abdomen when she goes to vomit, otherwise denies any abdominal pain.  She describes some chills but denies any fevers.  She has been coughing up thick yellow secretions during this time, also reports small streaks of blood in her cough more recently.  She has some discomfort in her chest when she takes a deep breath and reports feeling mildly short of breath.  She is currently receiving chemotherapy at [facility] for her throat cancer.     Physical Exam   Triage Vital Signs: ED Triage Vitals  Encounter Vitals Group     BP 07/25/24 1509 (!) 133/100     Girls Systolic BP Percentile --      Girls Diastolic BP Percentile --      Boys Systolic BP Percentile --      Boys Diastolic BP Percentile --      Pulse Rate 07/25/24 1509 97     Resp 07/25/24 1509 17     Temp 07/25/24 1509 98.3 F (36.8 C)     Temp Source 07/25/24 1509 Oral     SpO2 07/25/24 1509 100 %     Weight 07/25/24 1514 157 lb (71.2 kg)     Height 07/25/24 1514 5' 5 (1.651 m)     Head Circumference --      Peak Flow --      Pain Score 07/25/24 1514 0     Pain Loc --      Pain Education --      Exclude from Growth Chart --     Most recent vital signs: Vitals:   07/25/24 1800 07/25/24 1830  BP: 126/86 (!) 153/86  Pulse: 67 66  Resp: 15 13  Temp:    SpO2: 100% 100%    Constitutional: Alert and oriented. Eyes: Conjunctivae are normal. Head: Atraumatic. Nose:  No congestion/rhinnorhea. Mouth/Throat: Mucous membranes are moist.  Cardiovascular: Normal rate, regular rhythm. Grossly normal heart sounds.  2+ radial pulses bilaterally. Respiratory: Normal respiratory effort.  No retractions. Lungs CTAB. Gastrointestinal: Soft and nontender. No distention. Musculoskeletal: No lower extremity tenderness nor edema.  Neurologic:  Normal speech and language. No gross focal neurologic deficits are appreciated.    ED Results / Procedures / Treatments   Labs (all labs ordered are listed, but only abnormal results are displayed) Labs Reviewed  COMPREHENSIVE METABOLIC PANEL WITH GFR - Abnormal; Notable for the following components:      Result Value   Potassium 3.4 (*)    Glucose, Bld 110 (*)    Calcium  8.6 (*)    All other components within normal limits  CBC WITH DIFFERENTIAL/PLATELET - Abnormal; Notable for the following components:   RBC 3.31 (*)    Hemoglobin 10.9 (*)    HCT 31.1 (*)    All other components within normal limits  URINALYSIS, W/ REFLEX TO CULTURE (INFECTION SUSPECTED) - Abnormal; Notable for the following components:  Color, Urine YELLOW (*)    APPearance CLEAR (*)    Specific Gravity, Urine >1.046 (*)    All other components within normal limits  RESP PANEL BY RT-PCR (RSV, FLU A&B, COVID)  [id number]  GASTROINTESTINAL PANEL BY PCR, STOOL (REPLACES STOOL CULTURE)  C DIFFICILE QUICK SCREEN W PCR REFLEX    LACTIC ACID, PLASMA  TROPONIN I (HIGH SENSITIVITY)     EKG  ED ECG REPORT I, Carlin Palin, the attending physician, personally viewed and interpreted this ECG.   Date: 07/25/2024  EKG Time: 16:43  Rate: 68  Rhythm: normal sinus rhythm  Axis: Normal  Intervals:none  ST&T Change: None  RADIOLOGY Chest x-ray reviewed and interpreted by me with no infiltrate, edema, or effusion.  PROCEDURES:  Critical Care performed: No  Procedures   MEDICATIONS ORDERED IN ED: Medications  potassium chloride SA (KLOR-CON M)  CR tablet 40 mEq (has no administration in time range)  lactated ringers  bolus 1,000 mL (1,000 mLs Intravenous New Bag/Given 07/25/24 1617)  ondansetron  (ZOFRAN ) injection 4 mg (4 mg Intravenous Given 07/25/24 1616)  iohexol  (OMNIPAQUE ) 350 MG/ML injection 75 mL (75 mLs Intravenous Contrast Given 07/25/24 1630)     IMPRESSION / MDM / ASSESSMENT AND PLAN / ED COURSE  I reviewed the triage vital signs and the nursing notes.                              53 y.o. female with past medical history of squamous cell carcinoma of the supraglottis, asthma, PCOS, bipolar disorder who presents to the ED complaining of nausea, vomiting, diarrhea, and hemoptysis for the past 2 weeks.  Patient's presentation is most consistent with acute presentation with potential threat to life or bodily function.  Differential diagnosis includes, but is not limited to, ACS, PE, pneumonia, bronchitis, gastroenteritis, C. difficile, dehydration, electrolyte abnormality, AKI.  Patient nontoxic-appearing and in no acute distress, vital signs are unremarkable.  She is not in any respiratory distress and maintaining oxygen saturations at 99% on room air, no wheezing appreciated at this time.  Chest x-ray is unremarkable but will further assess with CTA of her chest to rule out PE given she is receiving active chemotherapy with hemoptysis.  Labs without significant anemia, leukocytosis, electrolyte abnormality, or AKI.  LFTs are unremarkable and lactic acid within normal limits, no features to suggest sepsis at this time.  We will also check COVID and flu testing, stool studies, and urine sample for potential infectious source.  Plan to treat symptomatically with IV Zofran  and hydrate with IV fluids.  CTA chest is negative for PE or pneumonia, does show evidence of bronchiolitis and suspect viral source of her symptoms.  COVID and flu testing is negative, patient has been unable to provide stool sample here in the ED as she has had  minimal diarrhea.  She has also not had any hemoptysis here in the ED.  She is appropriate for outpatient management with symptomatic treatment, counseled to follow-up with her PCP or oncology team, otherwise return to the ED for new or worsening symptoms.  Patient agrees with plan.      FINAL CLINICAL IMPRESSION(S) / ED DIAGNOSES   Final diagnoses:  Viral syndrome  Gastroenteritis     Rx / DC Orders   ED Discharge Orders          Ordered    ondansetron  (ZOFRAN -ODT) 4 MG disintegrating tablet  Every 8 hours PRN  07/25/24 1912    albuterol  (VENTOLIN  HFA) 108 (90 Base) MCG/ACT inhaler  Every 6 hours PRN       Note to Pharmacy: Please supply with spacer   07/25/24 1912    guaiFENesin  (MUCINEX ) 600 MG 12 hr tablet  2 times daily        07/25/24 1912             Note:  This document was prepared using Dragon voice recognition software and may include unintentional dictation errors.   Willo Dunnings, MD 07/25/24 [phone]

## 2024-07-25 NOTE — ED Triage Notes (Signed)
 Pt presents to the ED via POV from home with diarrhea, cough, vomiting, headache, chills, facial pain, and body aches x2 weeks. Pt reports generalized weakness. Pt was diagnosed with a respiratory infection and was started on amoxicillin . Pt is being treated for throat cancer. Hx of lupus.

## 2025-03-23 ENCOUNTER — Ambulatory Visit: Admitting: Nurse Practitioner
